# Patient Record
Sex: Female | Born: 1970 | Race: White | Hispanic: No | Marital: Married | Smoking: Never smoker
Health system: Southern US, Community
[De-identification: ages and names within clinical notes are randomized; demographics above are authoritative.]

## PROBLEM LIST (undated history)

## (undated) DIAGNOSIS — T7840XA Allergy, unspecified, initial encounter: Secondary | ICD-10-CM

## (undated) HISTORY — PX: EYE SURGERY: SHX253

## (undated) HISTORY — DX: Allergy, unspecified, initial encounter: T78.40XA

---

## 1999-06-04 ENCOUNTER — Other Ambulatory Visit: Admission: RE | Admit: 1999-06-04 | Discharge: 1999-06-04 | Payer: Self-pay | Admitting: Obstetrics and Gynecology

## 2000-04-06 ENCOUNTER — Other Ambulatory Visit: Admission: RE | Admit: 2000-04-06 | Discharge: 2000-04-06 | Payer: Self-pay | Admitting: Obstetrics and Gynecology

## 2000-04-25 ENCOUNTER — Encounter: Payer: Self-pay | Admitting: Obstetrics and Gynecology

## 2000-04-25 ENCOUNTER — Ambulatory Visit (HOSPITAL_COMMUNITY): Admission: RE | Admit: 2000-04-25 | Discharge: 2000-04-25 | Payer: Self-pay | Admitting: Obstetrics and Gynecology

## 2000-05-04 ENCOUNTER — Encounter: Payer: Self-pay | Admitting: Obstetrics and Gynecology

## 2000-05-04 ENCOUNTER — Ambulatory Visit (HOSPITAL_COMMUNITY): Admission: RE | Admit: 2000-05-04 | Discharge: 2000-05-04 | Payer: Self-pay | Admitting: Obstetrics and Gynecology

## 2000-05-26 ENCOUNTER — Observation Stay (HOSPITAL_COMMUNITY): Admission: RE | Admit: 2000-05-26 | Discharge: 2000-05-27 | Payer: Self-pay | Admitting: Obstetrics and Gynecology

## 2000-12-15 ENCOUNTER — Other Ambulatory Visit: Admission: RE | Admit: 2000-12-15 | Discharge: 2000-12-15 | Payer: Self-pay | Admitting: Obstetrics and Gynecology

## 2001-01-19 ENCOUNTER — Ambulatory Visit (HOSPITAL_COMMUNITY): Admission: RE | Admit: 2001-01-19 | Discharge: 2001-01-19 | Payer: Self-pay | Admitting: Obstetrics and Gynecology

## 2001-01-19 ENCOUNTER — Encounter: Payer: Self-pay | Admitting: Obstetrics and Gynecology

## 2001-03-21 ENCOUNTER — Other Ambulatory Visit: Admission: RE | Admit: 2001-03-21 | Discharge: 2001-03-21 | Payer: Self-pay | Admitting: Obstetrics and Gynecology

## 2002-09-24 ENCOUNTER — Other Ambulatory Visit: Admission: RE | Admit: 2002-09-24 | Discharge: 2002-09-24 | Payer: Self-pay | Admitting: Obstetrics and Gynecology

## 2003-06-25 ENCOUNTER — Other Ambulatory Visit: Admission: RE | Admit: 2003-06-25 | Discharge: 2003-06-25 | Payer: Self-pay | Admitting: Obstetrics and Gynecology

## 2004-01-18 ENCOUNTER — Inpatient Hospital Stay (HOSPITAL_COMMUNITY): Admission: AD | Admit: 2004-01-18 | Discharge: 2004-01-20 | Payer: Self-pay | Admitting: Obstetrics and Gynecology

## 2005-05-07 ENCOUNTER — Other Ambulatory Visit: Admission: RE | Admit: 2005-05-07 | Discharge: 2005-05-07 | Payer: Self-pay | Admitting: Obstetrics and Gynecology

## 2005-11-16 ENCOUNTER — Inpatient Hospital Stay (HOSPITAL_COMMUNITY): Admission: AD | Admit: 2005-11-16 | Discharge: 2005-11-18 | Payer: Self-pay | Admitting: Obstetrics and Gynecology

## 2010-03-30 ENCOUNTER — Other Ambulatory Visit: Payer: Self-pay | Admitting: Obstetrics and Gynecology

## 2010-03-30 DIAGNOSIS — Z1239 Encounter for other screening for malignant neoplasm of breast: Secondary | ICD-10-CM

## 2010-04-23 ENCOUNTER — Ambulatory Visit
Admission: RE | Admit: 2010-04-23 | Discharge: 2010-04-23 | Disposition: A | Discharge status: Home / Self Care | Payer: Self-pay | Source: Ambulatory Visit | Attending: Obstetrics and Gynecology | Admitting: Obstetrics and Gynecology

## 2010-04-23 DIAGNOSIS — Z1239 Encounter for other screening for malignant neoplasm of breast: Secondary | ICD-10-CM

## 2011-02-15 ENCOUNTER — Other Ambulatory Visit: Payer: Self-pay | Admitting: Allergy

## 2011-02-15 ENCOUNTER — Ambulatory Visit
Admission: RE | Admit: 2011-02-15 | Discharge: 2011-02-15 | Disposition: A | Discharge status: Home / Self Care | Payer: BC Managed Care – PPO | Source: Ambulatory Visit | Attending: Allergy | Admitting: Allergy

## 2011-02-15 DIAGNOSIS — J45909 Unspecified asthma, uncomplicated: Secondary | ICD-10-CM

## 2011-07-19 ENCOUNTER — Other Ambulatory Visit: Payer: Self-pay | Admitting: Obstetrics and Gynecology

## 2011-07-19 DIAGNOSIS — Z1231 Encounter for screening mammogram for malignant neoplasm of breast: Secondary | ICD-10-CM

## 2011-08-16 ENCOUNTER — Ambulatory Visit
Admission: RE | Admit: 2011-08-16 | Discharge: 2011-08-16 | Disposition: A | Discharge status: Home / Self Care | Payer: BC Managed Care – PPO | Source: Ambulatory Visit | Attending: Obstetrics and Gynecology | Admitting: Obstetrics and Gynecology

## 2011-08-16 DIAGNOSIS — Z1231 Encounter for screening mammogram for malignant neoplasm of breast: Secondary | ICD-10-CM

## 2012-09-18 ENCOUNTER — Other Ambulatory Visit: Payer: Self-pay

## 2012-09-18 DIAGNOSIS — Z1231 Encounter for screening mammogram for malignant neoplasm of breast: Secondary | ICD-10-CM

## 2012-10-05 ENCOUNTER — Ambulatory Visit
Admission: RE | Admit: 2012-10-05 | Discharge: 2012-10-05 | Disposition: A | Discharge status: Home / Self Care | Payer: BC Managed Care – PPO | Source: Ambulatory Visit

## 2012-10-05 DIAGNOSIS — Z1231 Encounter for screening mammogram for malignant neoplasm of breast: Secondary | ICD-10-CM

## 2013-09-04 ENCOUNTER — Other Ambulatory Visit: Payer: Self-pay

## 2013-09-04 DIAGNOSIS — Z1231 Encounter for screening mammogram for malignant neoplasm of breast: Secondary | ICD-10-CM

## 2013-10-08 ENCOUNTER — Ambulatory Visit
Admission: RE | Admit: 2013-10-08 | Discharge: 2013-10-08 | Disposition: A | Discharge status: Home / Self Care | Payer: BC Managed Care – PPO | Source: Ambulatory Visit

## 2013-10-08 DIAGNOSIS — Z1231 Encounter for screening mammogram for malignant neoplasm of breast: Secondary | ICD-10-CM

## 2014-09-25 ENCOUNTER — Other Ambulatory Visit: Payer: Self-pay

## 2014-09-25 DIAGNOSIS — Z1231 Encounter for screening mammogram for malignant neoplasm of breast: Secondary | ICD-10-CM

## 2014-10-30 ENCOUNTER — Ambulatory Visit
Admission: RE | Admit: 2014-10-30 | Discharge: 2014-10-30 | Disposition: A | Discharge status: Home / Self Care | Payer: BLUE CROSS/BLUE SHIELD | Source: Ambulatory Visit

## 2014-10-30 DIAGNOSIS — Z1231 Encounter for screening mammogram for malignant neoplasm of breast: Secondary | ICD-10-CM

## 2015-09-22 ENCOUNTER — Other Ambulatory Visit: Payer: Self-pay | Admitting: Obstetrics and Gynecology

## 2015-09-22 DIAGNOSIS — Z1231 Encounter for screening mammogram for malignant neoplasm of breast: Secondary | ICD-10-CM

## 2015-10-31 ENCOUNTER — Ambulatory Visit
Admission: RE | Admit: 2015-10-31 | Discharge: 2015-10-31 | Disposition: A | Discharge status: Home / Self Care | Payer: BLUE CROSS/BLUE SHIELD | Source: Ambulatory Visit | Attending: Obstetrics and Gynecology | Admitting: Obstetrics and Gynecology

## 2015-10-31 DIAGNOSIS — Z1231 Encounter for screening mammogram for malignant neoplasm of breast: Secondary | ICD-10-CM

## 2015-12-04 ENCOUNTER — Other Ambulatory Visit: Payer: Self-pay | Admitting: Obstetrics and Gynecology

## 2015-12-04 DIAGNOSIS — N63 Unspecified lump in unspecified breast: Secondary | ICD-10-CM

## 2015-12-12 ENCOUNTER — Ambulatory Visit
Admission: RE | Admit: 2015-12-12 | Discharge: 2015-12-12 | Disposition: A | Discharge status: Home / Self Care | Payer: BLUE CROSS/BLUE SHIELD | Source: Ambulatory Visit | Attending: Obstetrics and Gynecology | Admitting: Obstetrics and Gynecology

## 2015-12-12 DIAGNOSIS — N63 Unspecified lump in unspecified breast: Secondary | ICD-10-CM

## 2016-05-06 DIAGNOSIS — M79642 Pain in left hand: Secondary | ICD-10-CM | POA: Insufficient documentation

## 2016-05-06 DIAGNOSIS — M19042 Primary osteoarthritis, left hand: Secondary | ICD-10-CM | POA: Insufficient documentation

## 2018-05-12 IMAGING — MG 2D DIGITAL DIAGNOSTIC UNILATERAL LEFT MAMMOGRAM WITH CAD AND ADJ
8 series · 8 of 20 positions shown · non-contrast
Comparison: Recent screening mammogram 10/31/2015 and earlier
[facility]

CLINICAL DATA: 45-year-old patient with palpable lump in the
retroareolar left breast, 7 o'clock region. She noticed it initially
3-4 weeks ago, presenting as an area of pain when her arm brushed
against it while she was doing some work. She then felt for a lump.
She has felt a focal lump in this region and believes that the lump
has decreased in prominence since she first noticed it.

EXAM:
2D DIGITAL DIAGNOSTIC LEFT MAMMOGRAM WITH CAD AND ADJUNCT TOMO
ULTRASOUND LEFT BREAST

[L MLO]
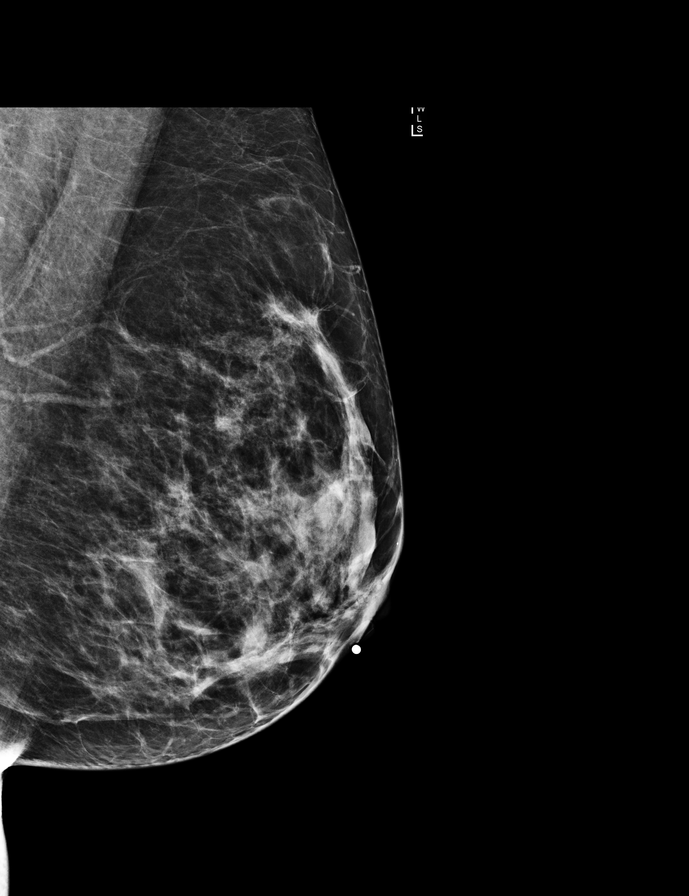

[L CC synth-2D]
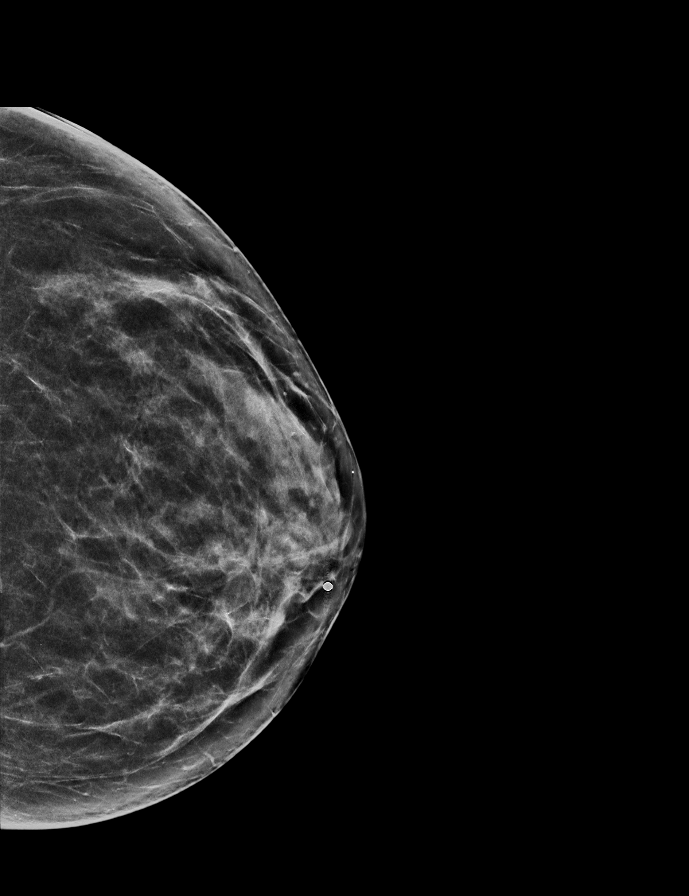

[L TAN]
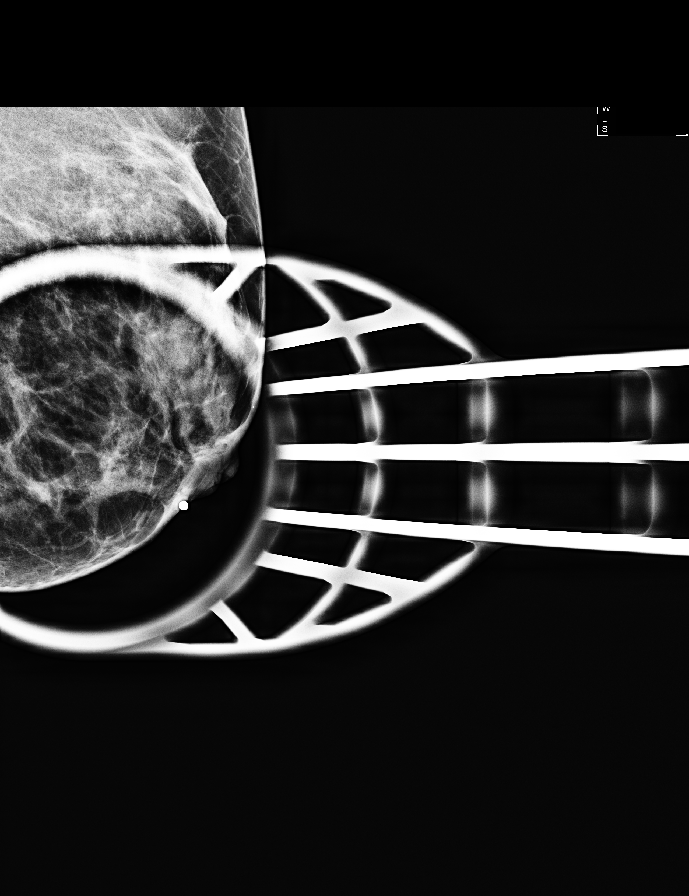

[L MLO synth-2D]
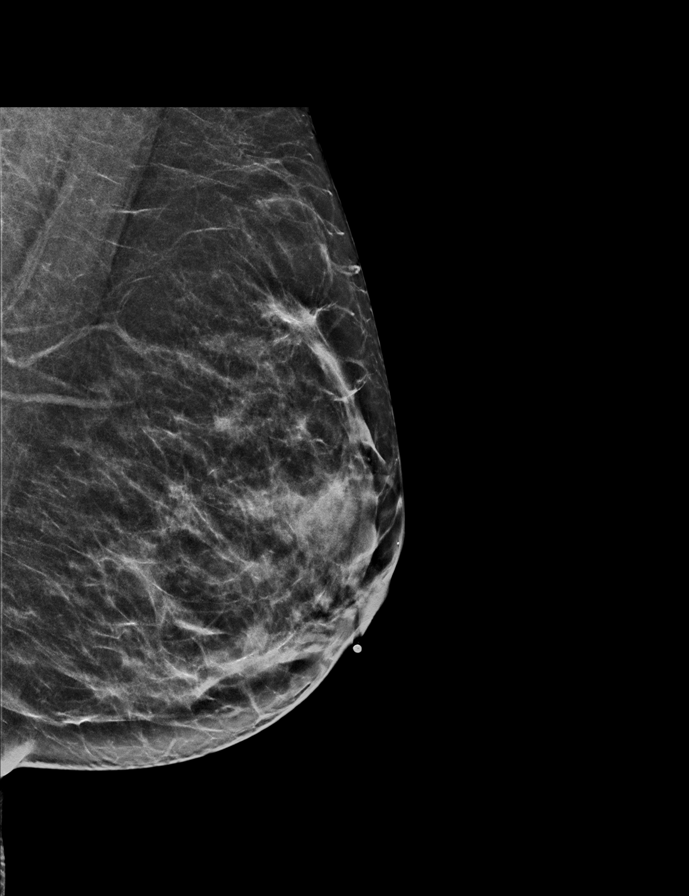

[L CC]
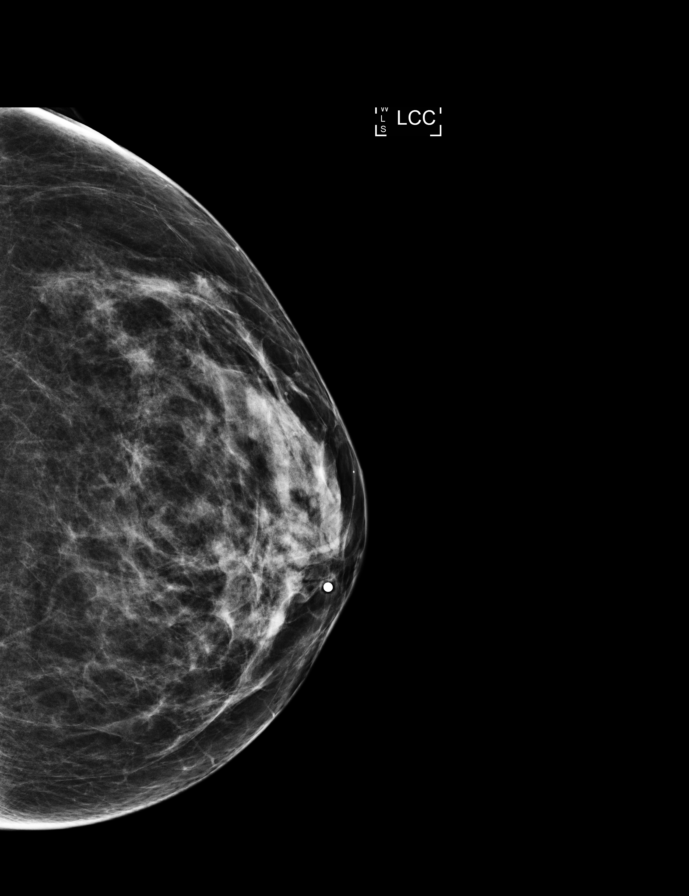

[L MLO tomo · tomo slice 29/57.0]
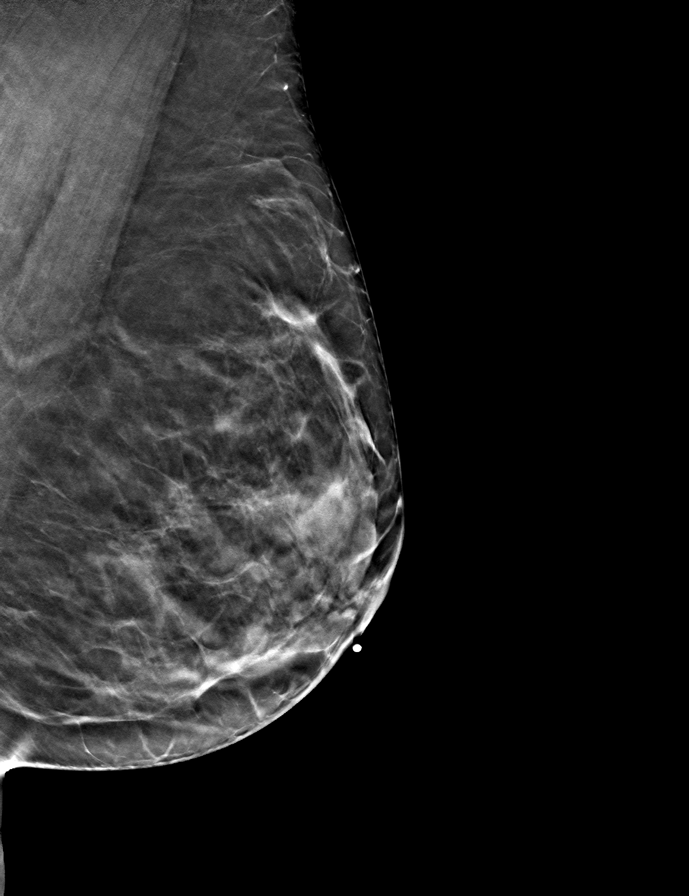

[L TAN tomo · tomo slice 23/45.0]
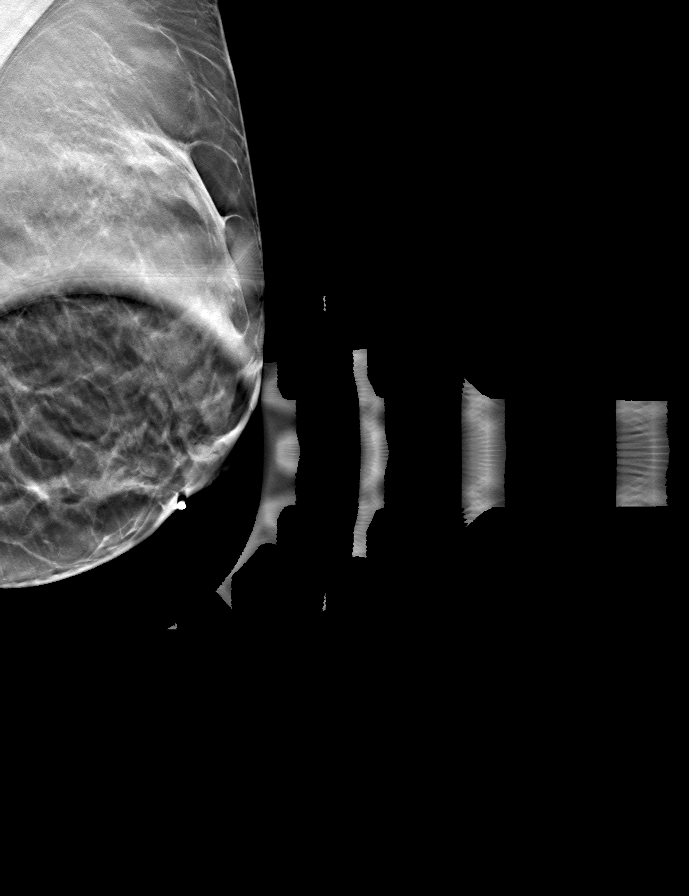

[L CC tomo · tomo slice 31/60.0]
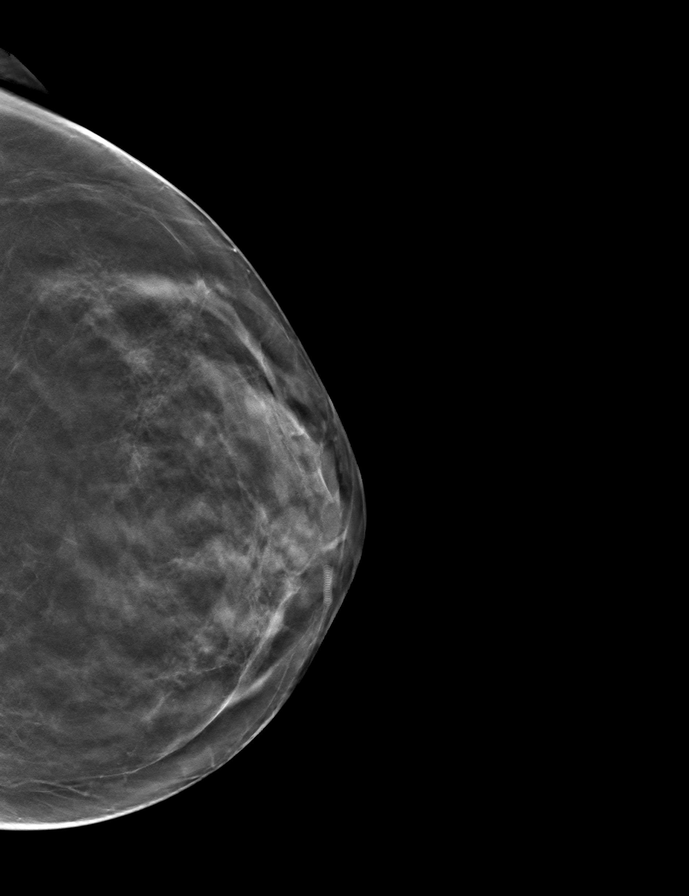

[8 of 20 positions shown; findings below may reference images not displayed]

ACR Breast Density Category c: The breast tissue is heterogeneously
dense, which may obscure small masses.
FINDINGS: The metallic skin marker was placed in the region of patient
concern, lower inner quadrant, near the areola. No mass,
architectural distortion, or suspicious microcalcification is
identified in the left breast. A spot tangential view of the region
of patient concern is negative.

Mammographic images were processed with CAD.

On physical exam, I do palpate a smooth ridgelike area in the region
of patient concern, 7 o'clock position retroareolar. I do not
palpate a discrete firm mass. The skin of the left breast appears
normal.

Targeted ultrasound is performed, showing an island of normal
retroareolar fibroglandular tissue is imaged in the region of
patient concern. No solid or cystic mass, duct ectasia, or fluid
collection.
IMPRESSION: No evidence of malignancy in the left breast.

RECOMMENDATION:
Screening mammogram in one year.(Code:[id number]) I have asked the
patient to return earlier if she has any progressive or new areas of
concern.

I have discussed the findings and recommendations with the patient.
Results were also provided in writing at the conclusion of the
visit. If applicable, a reminder letter will be sent to the patient
regarding the next appointment.

BI-RADS CATEGORY  1: Negative.

## 2019-07-03 ENCOUNTER — Ambulatory Visit (INDEPENDENT_AMBULATORY_CARE_PROVIDER_SITE_OTHER): Payer: 59 | Admitting: Physician Assistant

## 2019-07-03 ENCOUNTER — Encounter: Payer: Self-pay | Admitting: Physician Assistant

## 2019-07-03 ENCOUNTER — Other Ambulatory Visit: Payer: Self-pay

## 2019-07-03 VITALS — BP 128/80 | HR 103 | Temp 98.1°F | Resp 16

## 2019-07-03 DIAGNOSIS — M545 Low back pain, unspecified: Secondary | ICD-10-CM

## 2019-07-03 MED ORDER — MELOXICAM 7.5 MG PO TABS: 7.5000 mg | ORAL_TABLET | Freq: Every day | ORAL | 1 refills | Status: DC

## 2019-07-03 MED ORDER — TIZANIDINE HCL 4 MG PO TABS: 4.0000 mg | ORAL_TABLET | Freq: Three times a day (TID) | ORAL | 1 refills | Status: DC | PRN

## 2019-07-03 NOTE — Progress Notes (Signed)
Acute Office Visit  Subjective:    Patient ID: Lisa Cantrell, female    DOB: Jul 08, 1970, 49 y.o.   MRN: [id number]  Chief Complaint  Patient presents with  . Back Pain    HPI Patient is in today for low back pain On Saturday pt bent over to dry her hair and had immediate pain across her lower back causing movement difficult to get to full standing position  She has tried some medications with minimal relief Worse with standing straight and walking Denies pain/numbness in extremities  Past Medical History:  Diagnosis Date  . Allergy     Past Surgical History:  Procedure Laterality Date  . EYE SURGERY      Family History  Problem Relation Age of Onset  . Hypertension Other   . Diabetes Other   . Prostate cancer Other     Social History   Socioeconomic History  . Marital status: Married    Spouse name: Not on file  . Number of children: 2  . Years of education: Not on file  . Highest education level: Not on file  Occupational History  . Occupation: self employed  Tobacco Use  . Smoking status: Never Smoker  . Smokeless tobacco: Never Used  Substance and Sexual Activity  . Alcohol use: Never  . Drug use: Never  . Sexual activity: Not on file  Other Topics Concern  . Not on file  Social History Narrative  . Not on file   Social Determinants of Health   Financial Resource Strain:   . Difficulty of Paying Living Expenses:   Food Insecurity:   . Worried About Programme researcher, broadcasting/film/video in the Last Year:   . Barista in the Last Year:   Transportation Needs:   . Freight forwarder (Medical):   Marland Kitchen Lack of Transportation (Non-Medical):   Physical Activity:   . Days of Exercise per Week:   . Minutes of Exercise per Session:   Stress:   . Feeling of Stress :   Social Connections:   . Frequency of Communication with Friends and Family:   . Frequency of Social Gatherings with Friends and Family:   . Attends Religious Services:   . Active Member of  Clubs or Organizations:   . Attends Banker Meetings:   Marland Kitchen Marital Status:   Intimate Partner Violence:   . Fear of Current or Ex-Partner:   . Emotionally Abused:   Marland Kitchen Physically Abused:   . Sexually Abused:      Current Outpatient Medications:  .  fluticasone (FLOVENT HFA) 110 MCG/ACT inhaler, , Disp: , Rfl:  .  levocetirizine (XYZAL) 5 MG tablet, , Disp: , Rfl:  .  meloxicam (MOBIC) 7.5 MG tablet, Take 1 tablet (7.5 mg total) by mouth daily., Disp: 90 tablet, Rfl: 1 .  montelukast (SINGULAIR) 10 MG tablet, , Disp: , Rfl:  .  tiZANidine (ZANAFLEX) 4 MG tablet, Take 1 tablet (4 mg total) by mouth every 8 (eight) hours as needed for muscle spasms., Disp: 30 tablet, Rfl: 1   Allergies  Allergen Reactions  . Hydromorphone Rash  . Penicillin G Rash    CONSTITUTIONAL: Negative for chills, fatigue, fever, unintentional weight gain and unintentional weight loss.   CARDIOVASCULAR: Negative for chest pain, dizziness, palpitations and pedal edema.  RESPIRATORY: Negative for recent cough and dyspnea.  GASTROINTESTINAL: Negative for abdominal pain, acid reflux symptoms, constipation, diarrhea, nausea and vomiting.  MSK: see HPI INTEGUMENTARY: Negative for  rash.          Objective:    PHYSICAL EXAM:   VS: BP 128/80   Pulse (!) 103   Temp 98.1 F (36.7 C)   Resp 16   SpO2 94%   GEN: Well nourished, well developed, in acute pain walking slow and stooped  Cardiac: RRR; no murmurs, rubs, or gallops,no edema - no significant varicosities Respiratory:  normal respiratory rate and pattern with no distress - normal breath sounds with no rales, rhonchi, wheezes or rubs  MS: no deformity or atrophy - moderate tenderness bilaterally across lower back  Neuro:  Alert and Oriented x 3, Strength and sensation are intact - CN II-Xii grossly intact Psych: euthymic mood, appropriate affect and demeanor   Wt Readings from Last 3 Encounters:  No data found for Wt    Health  Maintenance Due  Topic Date Due  . HIV Screening  Never done  . TETANUS/TDAP  Never done  . PAP SMEAR-Modifier  Never done  . MAMMOGRAM  03/17/2019  . COLONOSCOPY  Never done    There are no preventive care reminders to display for this patient.        Assessment & Plan:   Problem List Items Addressed This Visit      Other   Acute bilateral low back pain without sciatica - Primary    rom exercises mobic and tizanadine as directed Alternate heat and ice      Relevant Medications   meloxicam (MOBIC) 7.5 MG tablet   tiZANidine (ZANAFLEX) 4 MG tablet       Meds ordered this encounter  Medications  . meloxicam (MOBIC) 7.5 MG tablet    Sig: Take 1 tablet (7.5 mg total) by mouth daily.    Dispense:  90 tablet    Refill:  1    Order Specific Question:   Supervising Provider    AnswerRochel Brome [id number]  . tiZANidine (ZANAFLEX) 4 MG tablet    Sig: Take 1 tablet (4 mg total) by mouth every 8 (eight) hours as needed for muscle spasms.    Dispense:  30 tablet    Refill:  1    Order Specific Question:   Supervising Provider    Answer:   COX, Lynder Parents     [place], PA-C

## 2019-07-03 NOTE — Assessment & Plan Note (Signed)
rom exercises mobic and tizanadine as directed Alternate heat and ice

## 2019-07-03 NOTE — Patient Instructions (Signed)

## 2019-11-19 ENCOUNTER — Encounter: Payer: Self-pay | Admitting: Physician Assistant

## 2019-11-19 ENCOUNTER — Other Ambulatory Visit: Payer: Self-pay

## 2019-11-19 ENCOUNTER — Ambulatory Visit (INDEPENDENT_AMBULATORY_CARE_PROVIDER_SITE_OTHER): Payer: 59 | Admitting: Physician Assistant

## 2019-11-19 VITALS — BP 120/74 | HR 78 | Temp 97.5°F | Resp 18 | Ht 61.5 in | Wt 176.0 lb

## 2019-11-19 DIAGNOSIS — Z23 Encounter for immunization: Secondary | ICD-10-CM | POA: Insufficient documentation

## 2019-11-19 DIAGNOSIS — M2559 Pain in other specified joint: Secondary | ICD-10-CM | POA: Diagnosis not present

## 2019-11-19 DIAGNOSIS — Z0000 Encounter for general adult medical examination without abnormal findings: Secondary | ICD-10-CM | POA: Diagnosis not present

## 2019-11-19 DIAGNOSIS — M2550 Pain in unspecified joint: Secondary | ICD-10-CM | POA: Insufficient documentation

## 2019-11-19 NOTE — Assessment & Plan Note (Signed)
flucelvax given 

## 2019-11-19 NOTE — Assessment & Plan Note (Signed)
Continue healthy lifestyle labwork pending Handout given

## 2019-11-19 NOTE — Assessment & Plan Note (Signed)
Continue mobic labwork pending

## 2019-11-19 NOTE — Progress Notes (Signed)
Subjective:  Patient ID: Lisa Cantrell, female    DOB: 01-01-1971  Age: 49 y.o. MRN: [id number]  Chief Complaint  Patient presents with  . Annual Exam    HPI Well Adult Physical: Patient here for a comprehensive physical exam.The patient reports wants labwork to check for RA - hands with pain/nodules Do you take any herbs or supplements that were not prescribed by a doctor? yes Are you taking calcium supplements? no Are you taking aspirin daily? no  Encounter for general adult medical examination without abnormal findings  Physical ("At Risk" items are starred): Patient's last physical exam was 1 year ago .  Blood Pressure: Normal (BP less than 120/80) ;  Medical History: Patient history reviewed ; Family history reviewed ;  Allergies Reviewed: No change in current allergies ;  Medications Reviewed: Medications reviewed - no changes ;  Lipids: Normal lipid levels ;  Smoking: Life-long non-smoker ;  Alcohol/Drug Use: Is a non-drinker ; No illicit drug use ;  Patient is not afflicted from Stress Incontinence and Urge Incontinence  Safety: reviewed ; Patient wears a seat belt, has smoke detectors, has carbon monoxide detectors, practices appropriate gun safety, and wears sunscreen with extended sun exposure. Dental Care: biannual cleanings, brushes and flosses daily. Ophthalmology/Optometry: Annual visit.  Hearing loss: none Vision impairments: none  Pt will be seeing GYN for pap smear and will get mammogram scheduled            Social Hx   Social History   Socioeconomic History  . Marital status: Married    Spouse name: Not on file  . Number of children: 2  . Years of education: Not on file  . Highest education level: Not on file  Occupational History  . Occupation: self employed  Tobacco Use  . Smoking status: Never Smoker  . Smokeless tobacco: Never Used  Vaping Use  . Vaping Use: Never used  Substance and Sexual Activity  . Alcohol use: Never  . Drug use:  Never  . Sexual activity: Not on file  Other Topics Concern  . Not on file  Social History Narrative  . Not on file   Social Determinants of Health   Financial Resource Strain:   . Difficulty of Paying Living Expenses: Not on file  Food Insecurity:   . Worried About Programme researcher, broadcasting/film/video in the Last Year: Not on file  . Ran Out of Food in the Last Year: Not on file  Transportation Needs:   . Lack of Transportation (Medical): Not on file  . Lack of Transportation (Non-Medical): Not on file  Physical Activity:   . Days of Exercise per Week: Not on file  . Minutes of Exercise per Session: Not on file  Stress:   . Feeling of Stress : Not on file  Social Connections:   . Frequency of Communication with Friends and Family: Not on file  . Frequency of Social Gatherings with Friends and Family: Not on file  . Attends Religious Services: Not on file  . Active Member of Clubs or Organizations: Not on file  . Attends Banker Meetings: Not on file  . Marital Status: Not on file   Past Medical History:  Diagnosis Date  . Allergy    Past Surgical History:  Procedure Laterality Date  . EYE SURGERY      Family History  Problem Relation Age of Onset  . Hypertension Other   . Diabetes Other   . Prostate cancer Other  Review of Systems CONSTITUTIONAL: Negative for chills, fatigue, fever, unintentional weight gain and unintentional weight loss.  E/N/T: Negative for ear pain, nasal congestion and sore throat.  CARDIOVASCULAR: Negative for chest pain, dizziness, palpitations and pedal edema.  RESPIRATORY: Negative for recent cough and dyspnea.  GASTROINTESTINAL: Negative for abdominal pain, acid reflux symptoms, constipation, diarrhea, nausea and vomiting.  MSK: joint pains - esp hands INTEGUMENTARY: Negative for rash.  NEUROLOGICAL: Negative for dizziness and headaches.  PSYCHIATRIC: Negative for sleep disturbance and to question depression screen.  Negative for  depression, negative for anhedonia.       Objective:  BP 120/74   Pulse 78   Temp (!) 97.5 F (36.4 C)   Resp 18   Ht 5' 1.5" (1.562 m)   Wt 176 lb (79.8 kg)   SpO2 99%   BMI 32.72 kg/m   BP/Weight 11/19/2019 07/03/2019  Systolic BP 120 128  Diastolic BP 74 80  Wt. (Lbs) 176 -  BMI 32.72 -    Physical Exam PHYSICAL EXAM:   VS: BP 120/74   Pulse 78   Temp (!) 97.5 F (36.4 C)   Resp 18   Ht 5' 1.5" (1.562 m)   Wt 176 lb (79.8 kg)   SpO2 99%   BMI 32.72 kg/m   GEN: Well nourished, well developed, in no acute distress  HEENT: normal external ears and nose - normal external auditory canals and TMS - hearing grossly normal - normal nasal mucosa and septum - Lips, Teeth and Gums - normal  Oropharynx - normal mucosa, palate, and posterior pharynx Neck: no JVD or masses - no thyromegaly Cardiac: RRR; no murmurs, rubs, or gallops,no edema - no significant varicosities Respiratory:  normal respiratory rate and pattern with no distress - normal breath sounds with no rales, rhonchi, wheezes or rubs GI: normal bowel sounds, no masses or tenderness MS: hands show arthritic changes Skin: warm and dry, no rash  Neuro:  Alert and Oriented x 3, Strength and sensation are intact - CN II-Xii grossly intact Psych: euthymic mood, appropriate affect and demeanor  No results found for: WBC, HGB, HCT, PLT, GLUCOSE, CHOL, TRIG, HDL, LDLDIRECT, LDLCALC, ALT, AST, NA, K, CL, CREATININE, BUN, CO2, TSH, PSA, INR, GLUF, HGBA1C, MICROALBUR    Assessment & Plan:  1. Annual physical exam - CBC with Differential/Platelet - Comprehensive metabolic panel - TSH - Lipid panel  2. Pain in other joint - Uric A+ANA+CRP+RF Qn  3. Need for tetanus, diphtheria, and acellular pertussis (Tdap) vaccine - Tdap vaccine greater than or equal to 7yo IM  4. Need for prophylactic vaccination and inoculation against influenza - Flu Vaccine MDCK QUAD PF    Body mass index is 32.72 kg/m.   These are  the goals we discussed: Goals   None      This is a list of the screening recommended for you and due dates:  Health Maintenance  Topic Date Due  . Tetanus Vaccine  Never done  . Flu Shot  Never done  . Pap Smear  11/19/2019*  . Mammogram  11/18/2020*  . Colon Cancer Screening  11/18/2020*  . COVID-19 Vaccine  Completed  .  Hepatitis C: One time screening is recommended by Center for Disease Control  (CDC) for  adults born from [birth year] through 1966.   Discontinued  . HIV Screening  Discontinued  *Topic was postponed. The date shown is not the original due date.     AN INDIVIDUALIZED CARE PLAN: was established or  reinforced today.   SELF MANAGEMENT: The patient and I together assessed ways to personally work towards obtaining the recommended goals  Support needs The patient and/or family needs were assessed and services were offered if appropriate.  No orders of the defined types were placed in this encounter.   Follow-up: Return in about 1 year (around 11/18/2020).  An After Visit Summary was printed and given to the patient.  Vickey Sages [facility] [phone]

## 2019-11-19 NOTE — Patient Instructions (Signed)

## 2019-11-19 NOTE — Assessment & Plan Note (Signed)
Tdap given.  

## 2019-11-20 LAB — CBC WITH DIFFERENTIAL/PLATELET
Basophils Absolute: 0.1 10*3/uL (ref 0.0–0.2)
Basos: 1 %
EOS (ABSOLUTE): 0.3 10*3/uL (ref 0.0–0.4)
Eos: 5 %
Hematocrit: 38.4 % (ref 34.0–46.6)
Hemoglobin: 12.5 g/dL (ref 11.1–15.9)
Immature Grans (Abs): 0 10*3/uL (ref 0.0–0.1)
Immature Granulocytes: 0 %
Lymphocytes Absolute: 2 10*3/uL (ref 0.7–3.1)
Lymphs: 39 %
MCH: 27.5 pg (ref 26.6–33.0)
MCHC: 32.6 g/dL (ref 31.5–35.7)
MCV: 84 fL (ref 79–97)
Monocytes Absolute: 0.6 10*3/uL (ref 0.1–0.9)
Monocytes: 12 %
Neutrophils Absolute: 2.2 10*3/uL (ref 1.4–7.0)
Neutrophils: 43 %
Platelets: 253 10*3/uL (ref 150–450)
RBC: 4.55 x10E6/uL (ref 3.77–5.28)
RDW: 13.7 % (ref 11.7–15.4)
WBC: 5.1 10*3/uL (ref 3.4–10.8)

## 2019-11-20 LAB — URIC A+ANA+CRP+RF QN
Anti Nuclear Antibody (ANA): NEGATIVE
CRP: 3 mg/L (ref 0–10)
Rhuematoid fact SerPl-aCnc: <10 IU/mL (ref 0.0–13.9)
Uric Acid: 4.9 mg/dL (ref 2.6–6.2)

## 2019-11-20 LAB — TSH: TSH: 1.42 u[IU]/mL (ref 0.450–4.500)

## 2019-11-20 LAB — LIPID PANEL
Chol/HDL Ratio: 3 ratio (ref 0.0–4.4)
Cholesterol, Total: 214 mg/dL — ABNORMAL HIGH (ref 100–199)
HDL: 72 mg/dL (ref >39)
LDL Chol Calc (NIH): 112 mg/dL — ABNORMAL HIGH (ref 0–99)
Triglycerides: 175 mg/dL — ABNORMAL HIGH (ref 0–149)
VLDL Cholesterol Cal: 30 mg/dL (ref 5–40)

## 2019-11-20 LAB — COMPREHENSIVE METABOLIC PANEL
ALT: 17 IU/L (ref 0–32)
AST: 17 IU/L (ref 0–40)
Albumin/Globulin Ratio: 2 (ref 1.2–2.2)
Albumin: 4.7 g/dL (ref 3.8–4.8)
Alkaline Phosphatase: 117 IU/L (ref 44–121)
BUN/Creatinine Ratio: 22 (ref 9–23)
BUN: 19 mg/dL (ref 6–24)
Bilirubin Total: 0.3 mg/dL (ref 0.0–1.2)
CO2: 22 mmol/L (ref 20–29)
Calcium: 9.7 mg/dL (ref 8.7–10.2)
Chloride: 105 mmol/L (ref 96–106)
Creatinine, Ser: 0.85 mg/dL (ref 0.57–1.00)
GFR calc Af Amer: 92 mL/min/{1.73_m2} (ref >59)
GFR calc non Af Amer: 80 mL/min/{1.73_m2} (ref >59)
Globulin, Total: 2.4 g/dL (ref 1.5–4.5)
Glucose: 98 mg/dL (ref 65–99)
Potassium: 4.3 mmol/L (ref 3.5–5.2)
Sodium: 141 mmol/L (ref 134–144)
Total Protein: 7.1 g/dL (ref 6.0–8.5)

## 2019-11-20 LAB — CARDIOVASCULAR RISK ASSESSMENT

## 2020-10-06 ENCOUNTER — Encounter: Payer: Self-pay | Admitting: Physician Assistant

## 2020-10-06 ENCOUNTER — Other Ambulatory Visit: Payer: Self-pay

## 2020-10-06 ENCOUNTER — Ambulatory Visit (INDEPENDENT_AMBULATORY_CARE_PROVIDER_SITE_OTHER): Payer: BLUE CROSS/BLUE SHIELD | Admitting: Physician Assistant

## 2020-10-06 ENCOUNTER — Ambulatory Visit (INDEPENDENT_AMBULATORY_CARE_PROVIDER_SITE_OTHER): Payer: BLUE CROSS/BLUE SHIELD

## 2020-10-06 VITALS — BP 120/74 | HR 76 | Temp 97.5°F | Ht 61.5 in | Wt 166.8 lb

## 2020-10-06 DIAGNOSIS — R6882 Decreased libido: Secondary | ICD-10-CM | POA: Insufficient documentation

## 2020-10-06 DIAGNOSIS — Z23 Encounter for immunization: Secondary | ICD-10-CM

## 2020-10-06 DIAGNOSIS — Z0000 Encounter for general adult medical examination without abnormal findings: Secondary | ICD-10-CM | POA: Diagnosis not present

## 2020-10-06 NOTE — Progress Notes (Signed)
   Covid-19 Vaccination Clinic  Name:  Lisa Cantrell    MRN: [id number] DOB: [birth year]  10/06/2020  [name] was observed post Covid-19 immunization for 15 minutes without incident. She was provided with Vaccine Information Sheet and instruction to access the V-Safe system.   [name] was instructed to call 911 with any severe reactions post vaccine: Difficulty breathing  Swelling of face and throat  A fast heartbeat  A bad rash all over body  Dizziness and weakness   Immunizations Administered     Name Date Dose VIS Date Route   PFIZER Comrnaty(Gray TOP) Covid-19 Vaccine 10/06/2020  2:30 PM 0.3 mL 02/14/2020 Intramuscular   Manufacturer: [employer], [employer]   Lot: [id number]   NDC: [phone]

## 2020-10-06 NOTE — Progress Notes (Signed)
Subjective:  Patient ID: Lisa Cantrell, female    DOB: 1971/01/27  Age: 50 y.o. MRN: [id number]  Chief Complaint  Patient presents with   Annual Exam    HPI Well Adult Physical: Patient here for a comprehensive physical exam.The patient reports no problems Do you take any herbs or supplements that were not prescribed by a doctor? no Are you taking calcium supplements? no Are you taking aspirin daily? no  Encounter for general adult medical examination without abnormal findings  Physical ("At Risk" items are starred): Patient's last physical exam was 1 year ago .  Smoking: Life-long non-smoker ;  Physical Activity: Exercises at least 3 times per week ;  Alcohol/Drug Use: Is a non-drinker ; No illicit drug use ;  Patient is not afflicted from Stress Incontinence and Urge Incontinence  Safety: reviewed. Patient wears a seat belt, has smoke detectors, has carbon monoxide detectors, practices appropriate gun safety, and wears sunscreen with extended sun exposure. Dental Care: biannual cleanings, brushes and flosses daily. Ophthalmology/Optometry: Annual visit.  Hearing loss: none Vision impairments: none  Last mammogram - 03/2020  Flowsheet Row Office Visit from 10/06/2020 in [facility]  PHQ-2 Total Score 0               Social Hx   Social History   Socioeconomic History   Marital status: Married    Spouse name: Not on file   Number of children: 2   Years of education: Not on file   Highest education level: Not on file  Occupational History   Occupation: self employed  Tobacco Use   Smoking status: Never   Smokeless tobacco: Never  Vaping Use   Vaping Use: Never used  Substance and Sexual Activity   Alcohol use: Never   Drug use: Never   Sexual activity: Not on file  Other Topics Concern   Not on file  Social History Narrative   Not on file   Social Determinants of Health   Financial Resource Strain: Not on file  Food Insecurity: Not on file   Transportation Needs: Not on file  Physical Activity: Not on file  Stress: Not on file  Social Connections: Not on file   Past Medical History:  Diagnosis Date   Allergy    Past Surgical History:  Procedure Laterality Date   EYE SURGERY      Family History  Problem Relation Age of Onset   Hypertension Other    Diabetes Other    Prostate cancer Other     Review of Systems  CONSTITUTIONAL: Negative for chills, fatigue, fever, unintentional weight gain and unintentional weight loss.  E/N/T: Negative for ear pain, nasal congestion and sore throat.  CARDIOVASCULAR: Negative for chest pain, dizziness, palpitations and pedal edema.  RESPIRATORY: Negative for recent cough and dyspnea.  GASTROINTESTINAL: Negative for abdominal pain, acid reflux symptoms, constipation, diarrhea, nausea and vomiting.  MSK: Negative for arthralgias and myalgias.  INTEGUMENTARY: Negative for rash.  NEUROLOGICAL: Negative for dizziness and headaches.  PSYCHIATRIC: Negative for sleep disturbance and to question depression screen.  Negative for depression, negative for anhedonia.      Objective:  BP 120/74 (BP Location: Left Arm, Patient Position: Sitting, Cuff Size: Normal)   Pulse 76   Temp (!) 97.5 F (36.4 C) (Temporal)   Ht 5' 1.5" (1.562 m)   Wt 166 lb 12.8 oz (75.7 kg)   SpO2 97%   BMI 31.01 kg/m   BP/Weight 10/06/2020 11/19/2019 07/03/2019  Systolic BP  120 120 128  Diastolic BP 74 74 80  Wt. (Lbs) 166.8 176 -  BMI 31.01 32.72 -    Physical Exam PHYSICAL EXAM:   VS: BP 120/74 (BP Location: Left Arm, Patient Position: Sitting, Cuff Size: Normal)   Pulse 76   Temp (!) 97.5 F (36.4 C) (Temporal)   Ht 5' 1.5" (1.562 m)   Wt 166 lb 12.8 oz (75.7 kg)   SpO2 97%   BMI 31.01 kg/m   GEN: Well nourished, well developed, in no acute distress  HEENT: normal external ears and nose - normal external auditory canals and TMS - hearing grossly normal - normal nasal mucosa and septum - Lips,  Teeth and Gums - normal  Oropharynx - normal mucosa, palate, and posterior pharynx Neck: no JVD or masses - no thyromegaly Cardiac: RRR; no murmurs, rubs, or gallops,no edema - no significant varicosities Respiratory:  normal respiratory rate and pattern with no distress - normal breath sounds with no rales, rhonchi, wheezes or rubs GI: normal bowel sounds, no masses or tenderness MS: no deformity or atrophy  Skin: warm and dry, no rash  Neuro:  Alert and Oriented x 3, Strength and sensation are intact - CN II-Xii grossly intact Psych: euthymic mood, appropriate affect and demeanor  Lab Results  Component Value Date   WBC 5.1 11/19/2019   HGB 12.5 11/19/2019   HCT 38.4 11/19/2019   PLT 253 11/19/2019   GLUCOSE 98 11/19/2019   CHOL 214 (H) 11/19/2019   TRIG 175 (H) 11/19/2019   HDL 72 11/19/2019   LDLCALC 112 (H) 11/19/2019   ALT 17 11/19/2019   AST 17 11/19/2019   NA 141 11/19/2019   K 4.3 11/19/2019   CL 105 11/19/2019   CREATININE 0.85 11/19/2019   BUN 19 11/19/2019   CO2 22 11/19/2019   TSH 1.420 11/19/2019      Assessment & Plan:  1. Well woman exam (no gynecological exam)  Pt recently had labs with GYN - normal   Body mass index is 31.01 kg/m.   These are the goals we discussed:  Goals   None      This is a list of the screening recommended for you and due dates:  Health Maintenance  Topic Date Due   COVID-19 Vaccine (3 - Pfizer risk series) 11/22/2019   Flu Shot  10/06/2020   Mammogram  11/18/2020*   Colon Cancer Screening  11/18/2020*   Zoster (Shingles) Vaccine (1 of 2) 01/06/2021*   Pap Smear  03/18/2023   Tetanus Vaccine  11/18/2029   HPV Vaccine  Aged Out   Pneumococcal Vaccination  Discontinued   Hepatitis C Screening: USPSTF Recommendation to screen - Ages 18-79 yo.  Discontinued   HIV Screening  Discontinued  *Topic was postponed. The date shown is not the original due date.     AN INDIVIDUALIZED CARE PLAN: was established or reinforced  today.   SELF MANAGEMENT: The patient and I together assessed ways to personally work towards obtaining the recommended goals  Support needs The patient and/or family needs were assessed and services were offered if appropriate.  No orders of the defined types were placed in this encounter.   Follow-up: Return in about 1 year (around 10/06/2021) for wellness exam.  An After Visit Summary was printed and given to the patient.  Jettie Pagan [facility] [phone]

## 2021-04-28 ENCOUNTER — Other Ambulatory Visit: Payer: Self-pay

## 2021-04-28 ENCOUNTER — Encounter: Payer: Self-pay | Admitting: Physician Assistant

## 2021-04-28 ENCOUNTER — Ambulatory Visit (INDEPENDENT_AMBULATORY_CARE_PROVIDER_SITE_OTHER): Payer: BC Managed Care – PPO | Admitting: Physician Assistant

## 2021-04-28 VITALS — BP 118/70 | HR 76 | Temp 97.5°F | Ht 61.25 in | Wt 162.4 lb

## 2021-04-28 DIAGNOSIS — R635 Abnormal weight gain: Secondary | ICD-10-CM | POA: Diagnosis not present

## 2021-04-28 DIAGNOSIS — Z6830 Body mass index (BMI) 30.0-30.9, adult: Secondary | ICD-10-CM

## 2021-04-28 MED ORDER — WEGOVY 0.25 MG/0.5ML ~~LOC~~ SOAJ: 0.2500 mg | SUBCUTANEOUS | 0 refills | Status: DC

## 2021-04-28 NOTE — Progress Notes (Signed)
Subjective:  Patient ID: Lisa Cantrell, female    DOB: 18-Apr-1970  Age: 51 y.o. MRN: [id number]  Chief Complaint  Patient presents with   Weight Gain    HPI  Pt in today with complaints of weight gain.  She states she is trying to watch her diet and is walking for exercise.  She has increased her water intake and decreased sodas Current Outpatient Medications on File Prior to Visit  Medication Sig Dispense Refill   buPROPion (WELLBUTRIN XL) 150 MG 24 hr tablet      estradiol (VIVELLE-DOT) 0.075 MG/24HR Dotti 0.075 mg/24 hr transdermal patch     folic acid (FOLVITE) 1 MG tablet Take 1 mg by mouth daily.     methotrexate 2.5 MG tablet Take 15 mg by mouth once a week.     progesterone (PROMETRIUM) 100 MG capsule Take 100 mg by mouth daily.     tiZANidine (ZANAFLEX) 4 MG tablet Take 1 tablet (4 mg total) by mouth every 8 (eight) hours as needed for muscle spasms. 30 tablet 1   No current facility-administered medications on file prior to visit.   Past Medical History:  Diagnosis Date   Allergy    Past Surgical History:  Procedure Laterality Date   EYE SURGERY      Family History  Problem Relation Age of Onset   Hypertension Other    Diabetes Other    Prostate cancer Other    Social History   Socioeconomic History   Marital status: Married    Spouse name: Not on file   Number of children: 2   Years of education: Not on file   Highest education level: Not on file  Occupational History   Occupation: self employed  Tobacco Use   Smoking status: Never   Smokeless tobacco: Never  Vaping Use   Vaping Use: Never used  Substance and Sexual Activity   Alcohol use: Never   Drug use: Never   Sexual activity: Not on file  Other Topics Concern   Not on file  Social History Narrative   Not on file   Social Determinants of Health   Financial Resource Strain: Not on file  Food Insecurity: Not on file  Transportation Needs: Not on file  Physical Activity: Not on file   Stress: Not on file  Social Connections: Not on file    Review of Systems CONSTITUTIONAL: see HPI  CARDIOVASCULAR: Negative for chest pain, dizziness, palpitations and pedal edema.  RESPIRATORY: Negative for recent cough and dyspnea.  GASTROINTESTINAL: Negative for abdominal pain, acid reflux symptoms, constipation, diarrhea, nausea and vomiting.     Objective:  BP 118/70 (BP Location: Left Arm, Patient Position: Sitting, Cuff Size: Normal)    Pulse 76    Temp (!) 97.5 F (36.4 C) (Temporal)    Ht 5' 1.25" (1.556 m)    Wt 162 lb 6.4 oz (73.7 kg)    SpO2 97%    BMI 30.44 kg/m   BP/Weight 04/28/2021 10/06/2020 11/19/2019  Systolic BP 118 120 120  Diastolic BP 70 74 74  Wt. (Lbs) 162.4 166.8 176  BMI 30.44 31.01 32.72  PHYSICAL EXAM:   VS: BP 118/70 (BP Location: Left Arm, Patient Position: Sitting, Cuff Size: Normal)    Pulse 76    Temp (!) 97.5 F (36.4 C) (Temporal)    Ht 5' 1.25" (1.556 m)    Wt 162 lb 6.4 oz (73.7 kg)    SpO2 97%    BMI 30.44 kg/m  GEN: Well nourished, well developed, in no acute distress  Cardiac: RRR; no murmurs, rubs, or gallops,no edema -  Respiratory:  normal respiratory rate and pattern with no distress - normal breath sounds with no rales, rhonchi, wheezes or rubs Psych: euthymic mood, appropriate affect and demeanor   Physical Exam  Diabetic Foot Exam - Simple   No data filed      Lab Results  Component Value Date   WBC 5.1 11/19/2019   HGB 12.5 11/19/2019   HCT 38.4 11/19/2019   PLT 253 11/19/2019   GLUCOSE 98 11/19/2019   CHOL 214 (H) 11/19/2019   TRIG 175 (H) 11/19/2019   HDL 72 11/19/2019   LDLCALC 112 (H) 11/19/2019   ALT 17 11/19/2019   AST 17 11/19/2019   NA 141 11/19/2019   K 4.3 11/19/2019   CL 105 11/19/2019   CREATININE 0.85 11/19/2019   BUN 19 11/19/2019   CO2 22 11/19/2019   TSH 1.420 11/19/2019      Assessment & Plan:   Problem List Items Addressed This Visit   None Visit Diagnoses     Body mass index  (BMI) 30.0-30.9, adult    -  Primary   Relevant Medications   Semaglutide-Weight Management (WEGOVY) 0.25 MG/0.5ML SOAJ   Weight gain       Relevant Orders   TSH     .  Meds ordered this encounter  Medications   Semaglutide-Weight Management (WEGOVY) 0.25 MG/0.5ML SOAJ    Sig: Inject 0.25 mg into the skin once a week.    Dispense:  2 mL    Refill:  0    Order Specific Question:   Supervising Provider    AnswerCorey Harold    Orders Placed This Encounter  Procedures   TSH     Follow-up: Return in about 6 weeks (around 06/09/2021) for follow up.  An After Visit Summary was printed and given to the patient.  Jettie Pagan [facility] [phone]

## 2021-04-29 LAB — TSH: TSH: 0.74 u[IU]/mL (ref 0.450–4.500)

## 2021-05-13 ENCOUNTER — Other Ambulatory Visit: Payer: Self-pay | Admitting: Physician Assistant

## 2021-05-13 DIAGNOSIS — Z6830 Body mass index (BMI) 30.0-30.9, adult: Secondary | ICD-10-CM

## 2021-05-13 MED ORDER — PHENTERMINE HCL 37.5 MG PO CAPS: 37.5000 mg | ORAL_CAPSULE | ORAL | 0 refills | Status: DC

## 2021-06-09 ENCOUNTER — Ambulatory Visit (INDEPENDENT_AMBULATORY_CARE_PROVIDER_SITE_OTHER): Payer: BC Managed Care – PPO | Admitting: Physician Assistant

## 2021-06-09 ENCOUNTER — Encounter: Payer: Self-pay | Admitting: Physician Assistant

## 2021-06-09 VITALS — BP 116/70 | HR 68 | Resp 18 | Ht 61.25 in | Wt 151.4 lb

## 2021-06-09 DIAGNOSIS — Z6830 Body mass index (BMI) 30.0-30.9, adult: Secondary | ICD-10-CM | POA: Diagnosis not present

## 2021-06-09 DIAGNOSIS — J3089 Other allergic rhinitis: Secondary | ICD-10-CM

## 2021-06-09 MED ORDER — ALBUTEROL SULFATE HFA 108 (90 BASE) MCG/ACT IN AERS: 2.0000 | INHALATION_SPRAY | Freq: Four times a day (QID) | RESPIRATORY_TRACT | 2 refills | Status: DC | PRN

## 2021-06-09 MED ORDER — PHENTERMINE HCL 37.5 MG PO CAPS: 37.5000 mg | ORAL_CAPSULE | ORAL | 1 refills | Status: DC

## 2021-06-09 NOTE — Progress Notes (Signed)
? ?Subjective:  ?Patient ID: Lisa Cantrell, female    DOB: 25-Dec-1970  Age: 51 y.o. MRN: [id number] ? ?Chief Complaint  ?Patient presents with  ? weight management  ? ? ?HPI ? Pt with complaints of allergy symptoms and requests albuterol inhaler to be refilled that she uses as needed - she is also taking otc allergy medication ? ?Pt here for follow up of weight management after being on adipex for the past month - she has lost 11 pounds and doing well on medication ?Current Outpatient Medications on File Prior to Visit  ?Medication Sig Dispense Refill  ? methotrexate (RHEUMATREX) 2.5 MG tablet Take by mouth.    ? buPROPion (WELLBUTRIN XL) 150 MG 24 hr tablet     ? estradiol (VIVELLE-DOT) 0.075 MG/24HR Dotti 0.075 mg/24 hr transdermal patch    ? folic acid (FOLVITE) 1 MG tablet Take 1 mg by mouth daily.    ? progesterone (PROMETRIUM) 100 MG capsule Take 100 mg by mouth daily.    ? Semaglutide-Weight Management (WEGOVY) 0.25 MG/0.5ML SOAJ Inject 0.25 mg into the skin once a week. 2 mL 0  ? tiZANidine (ZANAFLEX) 4 MG tablet Take 1 tablet (4 mg total) by mouth every 8 (eight) hours as needed for muscle spasms. 30 tablet 1  ? ?No current facility-administered medications on file prior to visit.  ? ?Past Medical History:  ?Diagnosis Date  ? Allergy   ? ?Past Surgical History:  ?Procedure Laterality Date  ? EYE SURGERY    ?  ?Family History  ?Problem Relation Age of Onset  ? Hypertension Other   ? Diabetes Other   ? Prostate cancer Other   ? ?Social History  ? ?Socioeconomic History  ? Marital status: Married  ?  Spouse name: Not on file  ? Number of children: 2  ? Years of education: Not on file  ? Highest education level: Not on file  ?Occupational History  ? Occupation: self employed  ?Tobacco Use  ? Smoking status: Never  ? Smokeless tobacco: Never  ?Vaping Use  ? Vaping Use: Never used  ?Substance and Sexual Activity  ? Alcohol use: Never  ? Drug use: Never  ? Sexual activity: Not on file  ?Other Topics Concern  ?  Not on file  ?Social History Narrative  ? Not on file  ? ?Social Determinants of Health  ? ?Financial Resource Strain: Not on file  ?Food Insecurity: Not on file  ?Transportation Needs: Not on file  ?Physical Activity: Not on file  ?Stress: Not on file  ?Social Connections: Not on file  ? ? ?Review of Systems ?CONSTITUTIONAL: Negative for chills, fatigue, fever, unintentional weight gain and unintentional weight loss.  ?CARDIOVASCULAR: Negative for chest pain, dizziness, palpitations and pedal edema.  ?RESPIRATORY: Negative for recent cough and dyspnea.  ?INTEGUMENTARY: Negative for rash.  ? ? ? ?Objective:  ?BP 116/70   Pulse 68   Resp 18   Ht 5' 1.25" (1.556 m)   Wt 151 lb 6.4 oz (68.7 kg)   SpO2 99%   BMI 28.37 kg/m?  ? ? ?  06/09/2021  ? 10:04 AM 04/28/2021  ?  3:19 PM 10/06/2020  ?  2:15 PM  ?BP/Weight  ?Systolic BP 116 118 120  ?Diastolic BP 70 70 74  ?Wt. (Lbs) 151.4 162.4 166.8  ?BMI 28.37 kg/m2 30.44 kg/m2 31.01 kg/m2  ? ? ?Physical Exam ?PHYSICAL EXAM:  ? ?VS: BP 116/70   Pulse 68   Resp 18   Ht 5'  1.25" (1.556 m)   Wt 151 lb 6.4 oz (68.7 kg)   SpO2 99%   BMI 28.37 kg/m?  ? ?GEN: Well nourished, well developed, in no acute distress  ?Cardiac: RRR; no murmurs,  ?Respiratory:  normal respiratory rate and pattern with no distress - normal breath sounds with no rales, rhonchi, wheezes or rubs ?Psych: euthymic mood, appropriate affect and demeanor ? ?Diabetic Foot Exam - Simple   ?No data filed ?  ?  ? ?Lab Results  ?Component Value Date  ? WBC 5.1 11/19/2019  ? HGB 12.5 11/19/2019  ? HCT 38.4 11/19/2019  ? PLT 253 11/19/2019  ? GLUCOSE 98 11/19/2019  ? CHOL 214 (H) 11/19/2019  ? TRIG 175 (H) 11/19/2019  ? HDL 72 11/19/2019  ? LDLCALC 112 (H) 11/19/2019  ? ALT 17 11/19/2019  ? AST 17 11/19/2019  ? NA 141 11/19/2019  ? K 4.3 11/19/2019  ? CL 105 11/19/2019  ? CREATININE 0.85 11/19/2019  ? BUN 19 11/19/2019  ? CO2 22 11/19/2019  ? TSH 0.740 04/28/2021  ? ? ? ? ?Assessment & Plan:  ? ?Problem List Items  Addressed This Visit   ?None ?Visit Diagnoses   ? ? Seasonal allergic rhinitis due to other allergic trigger    -  Primary  ? Relevant Medications  ? albuterol (VENTOLIN HFA) 108 (90 Base) MCG/ACT inhaler  ? Body mass index (BMI) 30.0-30.9, adult      ? Relevant Medications  ? phentermine 37.5 MG capsule  ? ?  ?. ? ?Meds ordered this encounter  ?Medications  ? albuterol (VENTOLIN HFA) 108 (90 Base) MCG/ACT inhaler  ?  Sig: Inhale 2 puffs into the lungs every 6 (six) hours as needed for wheezing or shortness of breath.  ?  Dispense:  8 g  ?  Refill:  2  ?  Order Specific Question:   Supervising Provider  ?  AnswerBlane Ohara [[id number]]  ? phentermine 37.5 MG capsule  ?  Sig: Take 1 capsule (37.5 mg total) by mouth every morning.  ?  Dispense:  30 capsule  ?  Refill:  1  ?  Order Specific Question:   Supervising Provider  ?  AnswerBlane Ohara [[id number]]  ? ? ?No orders of the defined types were placed in this encounter. ?  ? ?Follow-up: Return in about 2 months (around 08/09/2021) for fasting physical. ? ?An After Visit Summary was printed and given to the patient. ? ?SARA R Arling Cerone, PA-C ?[facility] ?[phone]

## 2021-08-13 ENCOUNTER — Other Ambulatory Visit: Payer: Self-pay | Admitting: Physician Assistant

## 2021-08-13 DIAGNOSIS — Z6830 Body mass index (BMI) 30.0-30.9, adult: Secondary | ICD-10-CM

## 2021-08-17 ENCOUNTER — Other Ambulatory Visit: Payer: Self-pay

## 2021-08-17 DIAGNOSIS — Z0000 Encounter for general adult medical examination without abnormal findings: Secondary | ICD-10-CM

## 2021-08-18 ENCOUNTER — Other Ambulatory Visit: Payer: Self-pay

## 2021-08-18 ENCOUNTER — Other Ambulatory Visit: Payer: BC Managed Care – PPO

## 2021-08-18 DIAGNOSIS — Z0000 Encounter for general adult medical examination without abnormal findings: Secondary | ICD-10-CM

## 2021-08-19 LAB — CBC WITH DIFFERENTIAL/PLATELET
Basophils Absolute: 0 10*3/uL (ref 0.0–0.2)
Basos: 1 %
EOS (ABSOLUTE): 0.1 10*3/uL (ref 0.0–0.4)
Eos: 3 %
Hematocrit: 34.7 % (ref 34.0–46.6)
Hemoglobin: 11.8 g/dL (ref 11.1–15.9)
Immature Grans (Abs): 0 10*3/uL (ref 0.0–0.1)
Immature Granulocytes: 0 %
Lymphocytes Absolute: 1.7 10*3/uL (ref 0.7–3.1)
Lymphs: 41 %
MCH: 31.4 pg (ref 26.6–33.0)
MCHC: 34 g/dL (ref 31.5–35.7)
MCV: 92 fL (ref 79–97)
Monocytes Absolute: 0.3 10*3/uL (ref 0.1–0.9)
Monocytes: 8 %
Neutrophils Absolute: 1.9 10*3/uL (ref 1.4–7.0)
Neutrophils: 47 %
Platelets: 241 10*3/uL (ref 150–450)
RBC: 3.76 x10E6/uL — ABNORMAL LOW (ref 3.77–5.28)
RDW: 14.4 % (ref 11.7–15.4)
WBC: 4.1 10*3/uL (ref 3.4–10.8)

## 2021-08-19 LAB — COMPREHENSIVE METABOLIC PANEL
ALT: 17 IU/L (ref 0–32)
AST: 12 IU/L (ref 0–40)
Albumin/Globulin Ratio: 2.1 (ref 1.2–2.2)
Albumin: 4.2 g/dL (ref 3.8–4.9)
Alkaline Phosphatase: 52 IU/L (ref 44–121)
BUN/Creatinine Ratio: 16 (ref 9–23)
BUN: 14 mg/dL (ref 6–24)
Bilirubin Total: 0.4 mg/dL (ref 0.0–1.2)
CO2: 21 mmol/L (ref 20–29)
Calcium: 8.9 mg/dL (ref 8.7–10.2)
Chloride: 105 mmol/L (ref 96–106)
Creatinine, Ser: 0.9 mg/dL (ref 0.57–1.00)
Globulin, Total: 2 g/dL (ref 1.5–4.5)
Glucose: 85 mg/dL (ref 70–99)
Potassium: 4.2 mmol/L (ref 3.5–5.2)
Sodium: 140 mmol/L (ref 134–144)
Total Protein: 6.2 g/dL (ref 6.0–8.5)
eGFR: 77 mL/min/{1.73_m2} (ref >59)

## 2021-08-19 LAB — LIPID PANEL
Chol/HDL Ratio: 2.5 ratio (ref 0.0–4.4)
Cholesterol, Total: 181 mg/dL (ref 100–199)
HDL: 72 mg/dL (ref >39)
LDL Chol Calc (NIH): 91 mg/dL (ref 0–99)
Triglycerides: 103 mg/dL (ref 0–149)
VLDL Cholesterol Cal: 18 mg/dL (ref 5–40)

## 2021-08-19 LAB — TSH: TSH: 1.4 u[IU]/mL (ref 0.450–4.500)

## 2021-08-19 LAB — CARDIOVASCULAR RISK ASSESSMENT

## 2021-08-25 ENCOUNTER — Encounter: Payer: Self-pay | Admitting: Physician Assistant

## 2021-08-25 ENCOUNTER — Other Ambulatory Visit: Payer: Self-pay | Admitting: Physician Assistant

## 2021-08-25 ENCOUNTER — Ambulatory Visit (INDEPENDENT_AMBULATORY_CARE_PROVIDER_SITE_OTHER): Payer: BC Managed Care – PPO | Admitting: Physician Assistant

## 2021-08-25 VITALS — BP 108/74 | HR 81 | Resp 18 | Ht 61.25 in | Wt 152.2 lb

## 2021-08-25 DIAGNOSIS — Z6830 Body mass index (BMI) 30.0-30.9, adult: Secondary | ICD-10-CM

## 2021-08-25 DIAGNOSIS — R82998 Other abnormal findings in urine: Secondary | ICD-10-CM

## 2021-08-25 DIAGNOSIS — Z0000 Encounter for general adult medical examination without abnormal findings: Secondary | ICD-10-CM

## 2021-08-25 LAB — POCT URINALYSIS DIP (CLINITEK)
Bilirubin, UA: NEGATIVE
Blood, UA: NEGATIVE
Glucose, UA: NEGATIVE mg/dL
Ketones, POC UA: NEGATIVE mg/dL
Nitrite, UA: NEGATIVE
POC PROTEIN,UA: NEGATIVE
Spec Grav, UA: 1.015 (ref 1.010–1.025)
Urobilinogen, UA: 0.2 E.U./dL
pH, UA: 6 (ref 5.0–8.0)

## 2021-08-25 MED ORDER — WEGOVY 0.25 MG/0.5ML ~~LOC~~ SOAJ: 0.2500 mg | SUBCUTANEOUS | 0 refills | Status: DC

## 2021-08-25 NOTE — Progress Notes (Signed)
Subjective:  Patient ID: Lisa Cantrell, female    DOB: Mar 10, 1970  Age: 51 y.o. MRN: [id number]  Chief Complaint  Patient presents with   Annual Exam    HPI Well Adult Physical: Patient here for a comprehensive physical exam.The patient reports no problems Do you take any herbs or supplements that were not prescribed by a doctor? no Are you taking calcium supplements? no Are you taking aspirin daily? No Pt had been taking adipex but has now changed insurance and would like to see if wegovy is covered and try that instead  Encounter for general adult medical examination without abnormal findings  Physical ("At Risk" items are starred): Patient's last physical exam was 1 year ago .  Patient is not afflicted from Stress Incontinence and Urge Incontinence  Patient wears a seat belt, has smoke detectors, has carbon monoxide detectors, practices appropriate gun safety, and wears sunscreen with extended sun exposure. Dental Care: biannual cleanings, brushes and flosses daily. Ophthalmology/Optometry: overdue Hearing loss: none Vision impairments: none   LMP: about 2 years ago Last mammogram normal 4/23 Due for colonoscopy but defers at this time [employer] Visit from 10/06/2020 in [facility]  PHQ-2 Total Score 0               Social Hx   Social History   Socioeconomic History   Marital status: Married    Spouse name: Not on file   Number of children: 2   Years of education: Not on file   Highest education level: Not on file  Occupational History   Occupation: self employed  Tobacco Use   Smoking status: Never   Smokeless tobacco: Never  Vaping Use   Vaping Use: Never used  Substance and Sexual Activity   Alcohol use: Never   Drug use: Never   Sexual activity: Not on file  Other Topics Concern   Not on file  Social History Narrative   Not on file   Social Determinants of Health   Financial Resource Strain: Not on file  Food Insecurity: Not  on file  Transportation Needs: Not on file  Physical Activity: Not on file  Stress: Not on file  Social Connections: Not on file   Past Medical History:  Diagnosis Date   Allergy    Past Surgical History:  Procedure Laterality Date   EYE SURGERY      Family History  Problem Relation Age of Onset   Hypertension Other    Diabetes Other    Prostate cancer Other     Review of Systems CONSTITUTIONAL: Negative for chills, fatigue, fever, unintentional weight gain and unintentional weight loss.  E/N/T: Negative for ear pain, nasal congestion and sore throat.  CARDIOVASCULAR: Negative for chest pain, dizziness, palpitations and pedal edema.  RESPIRATORY: Negative for recent cough and dyspnea.  GASTROINTESTINAL: Negative for abdominal pain, acid reflux symptoms, constipation, diarrhea, nausea and vomiting.  MSK: Negative for arthralgias and myalgias.  INTEGUMENTARY: Negative for rash.  NEUROLOGICAL: Negative for dizziness and headaches.  PSYCHIATRIC: Negative for sleep disturbance and to question depression screen.  Negative for depression, negative for anhedonia.       Objective:  PHYSICAL EXAM:   VS: BP 108/74   Pulse 81   Resp 18   Ht 5' 1.25" (1.556 m)   Wt 152 lb 3.2 oz (69 kg)   SpO2 98%   BMI 28.52 kg/m   GEN: Well nourished, well developed, in no acute distress  HEENT: normal external  ears and nose - normal external auditory canals and TMS - hearing grossly normal - - Lips, Teeth and Gums - normal  Oropharynx - normal mucosa, palate, and posterior pharynx Cardiac: RRR; no murmurs, rubs, or gallops,no edema -  Respiratory:  normal respiratory rate and pattern with no distress - normal breath sounds with no rales, rhonchi, wheezes or rubs GI: normal bowel sounds, no masses or tenderness MS: no deformity or atrophy  Skin: warm and dry, no rash  Neuro:  Alert and Oriented x 3, Strength and sensation are intact - CN II-Xii grossly intact Psych: euthymic mood,  appropriate affect and demeanor  Office Visit on 08/25/2021  Component Date Value Ref Range Status   Color, UA 08/25/2021 yellow  yellow Final   Clarity, UA 08/25/2021 clear  clear Final   Glucose, UA 08/25/2021 negative  negative mg/dL Final   Bilirubin, UA 19/14/7829 negative  negative Final   Ketones, POC UA 08/25/2021 negative  negative mg/dL Final   Spec Grav, UA 56/21/3086 1.015  1.010 - 1.025 Final   Blood, UA 08/25/2021 negative  negative Final   pH, UA 08/25/2021 6.0  5.0 - 8.0 Final   POC PROTEIN,UA 08/25/2021 negative  negative, trace Final   Urobilinogen, UA 08/25/2021 0.2  0.2 or 1.0 E.U./dL Final   Nitrite, UA 57/84/6962 Negative  Negative Final   Leukocytes, UA 08/25/2021 Moderate (2+) (A)  Negative Final     Lab Results  Component Value Date   WBC 4.1 08/18/2021   HGB 11.8 08/18/2021   HCT 34.7 08/18/2021   PLT 241 08/18/2021   GLUCOSE 85 08/18/2021   CHOL 181 08/18/2021   TRIG 103 08/18/2021   HDL 72 08/18/2021   LDLCALC 91 08/18/2021   ALT 17 08/18/2021   AST 12 08/18/2021   NA 140 08/18/2021   K 4.2 08/18/2021   CL 105 08/18/2021   CREATININE 0.90 08/18/2021   BUN 14 08/18/2021   CO2 21 08/18/2021   TSH 1.400 08/18/2021      Assessment & Plan:   Problem List Items Addressed This Visit       Other   Annual physical exam - Primary   Relevant Orders   POCT URINALYSIS DIP (CLINITEK)   Other Visit Diagnoses     Leukocytes in urine       Relevant Orders   Urine Culture   Body mass index (BMI) 30.0-30.9, adult       Relevant Medications   Semaglutide-Weight Management (WEGOVY) 0.25 MG/0.5ML SOAJ         Body mass index is 28.52 kg/m.   These are the goals we discussed:  Goals   None      This is a list of the screening recommended for you and due dates:  Health Maintenance  Topic Date Due   Colon Cancer Screening  06/10/2022*   Flu Shot  10/06/2021   Mammogram  06/09/2022   Pap Smear  03/18/2023   Tetanus Vaccine   11/18/2029   HPV Vaccine  Aged Out   COVID-19 Vaccine  Discontinued   Hepatitis C Screening: USPSTF Recommendation to screen - Ages 18-79 yo.  Discontinued   HIV Screening  Discontinued   Zoster (Shingles) Vaccine  Discontinued  *Topic was postponed. The date shown is not the original due date.     Meds ordered this encounter  Medications   Semaglutide-Weight Management (WEGOVY) 0.25 MG/0.5ML SOAJ    Sig: Inject 0.25 mg into the skin once a week.  Dispense:  2 mL    Refill:  0    Order Specific Question:   Supervising Provider    Answer:   Corey Harold    Follow-up: Return if symptoms worsen or fail to improve.  An After Visit Summary was printed and given to the patient.  Jettie Pagan [facility] [phone]

## 2021-08-27 ENCOUNTER — Telehealth: Payer: Self-pay

## 2021-08-27 NOTE — Telephone Encounter (Signed)
PATIENT HAS BEEN APPROVED THROUGH INSURANCE FOR WEGOVY FOR 08/26/21 TO 03/28/2022.

## 2021-08-28 ENCOUNTER — Other Ambulatory Visit: Payer: Self-pay | Admitting: Physician Assistant

## 2021-08-28 DIAGNOSIS — N3000 Acute cystitis without hematuria: Secondary | ICD-10-CM

## 2021-08-28 LAB — URINE CULTURE

## 2021-08-28 MED ORDER — NITROFURANTOIN MONOHYD MACRO 100 MG PO CAPS: 100.0000 mg | ORAL_CAPSULE | Freq: Two times a day (BID) | ORAL | 0 refills | Status: DC

## 2021-09-14 ENCOUNTER — Ambulatory Visit (INDEPENDENT_AMBULATORY_CARE_PROVIDER_SITE_OTHER): Payer: BC Managed Care – PPO

## 2021-09-14 DIAGNOSIS — N3000 Acute cystitis without hematuria: Secondary | ICD-10-CM | POA: Diagnosis not present

## 2021-09-14 LAB — POCT URINALYSIS DIP (CLINITEK)
Bilirubin, UA: NEGATIVE
Blood, UA: NEGATIVE
Glucose, UA: NEGATIVE mg/dL
Ketones, POC UA: NEGATIVE mg/dL
Leukocytes, UA: NEGATIVE
Nitrite, UA: NEGATIVE
Spec Grav, UA: 1.02 (ref 1.010–1.025)
Urobilinogen, UA: 0.2 E.U./dL
pH, UA: 6 (ref 5.0–8.0)

## 2021-09-14 NOTE — Progress Notes (Signed)
Patient came in for repeat UA due to an acute Acute cystitis.   Provider made aware of UA results.

## 2021-09-27 ENCOUNTER — Other Ambulatory Visit: Payer: Self-pay | Admitting: Physician Assistant

## 2021-09-27 DIAGNOSIS — Z6830 Body mass index (BMI) 30.0-30.9, adult: Secondary | ICD-10-CM

## 2021-09-28 ENCOUNTER — Other Ambulatory Visit: Payer: Self-pay | Admitting: Physician Assistant

## 2021-09-28 DIAGNOSIS — Z6830 Body mass index (BMI) 30.0-30.9, adult: Secondary | ICD-10-CM

## 2021-09-28 MED ORDER — WEGOVY 0.5 MG/0.5ML ~~LOC~~ SOAJ: 0.5000 mg | SUBCUTANEOUS | 0 refills | Status: DC

## 2021-10-06 ENCOUNTER — Ambulatory Visit: Payer: BC Managed Care – PPO | Admitting: Physician Assistant

## 2021-10-30 ENCOUNTER — Other Ambulatory Visit: Payer: Self-pay

## 2021-10-30 MED ORDER — NITROFURANTOIN MONOHYD MACRO 100 MG PO CAPS: 100.0000 mg | ORAL_CAPSULE | Freq: Two times a day (BID) | ORAL | 0 refills | Status: DC

## 2021-11-02 ENCOUNTER — Ambulatory Visit: Payer: BC Managed Care – PPO | Admitting: Physician Assistant

## 2021-11-02 ENCOUNTER — Encounter: Payer: Self-pay | Admitting: Physician Assistant

## 2021-11-02 VITALS — BP 106/68 | HR 88 | Resp 18 | Ht 61.25 in | Wt 152.0 lb

## 2021-11-02 DIAGNOSIS — N3001 Acute cystitis with hematuria: Secondary | ICD-10-CM | POA: Diagnosis not present

## 2021-11-02 DIAGNOSIS — Z6830 Body mass index (BMI) 30.0-30.9, adult: Secondary | ICD-10-CM | POA: Insufficient documentation

## 2021-11-02 LAB — POCT URINALYSIS DIP (CLINITEK)
Bilirubin, UA: NEGATIVE
Glucose, UA: NEGATIVE mg/dL
Nitrite, UA: NEGATIVE
Spec Grav, UA: 1.025 (ref 1.010–1.025)
Urobilinogen, UA: 0.2 E.U./dL
pH, UA: 5.5 (ref 5.0–8.0)

## 2021-11-02 MED ORDER — WEGOVY 1 MG/0.5ML ~~LOC~~ SOAJ: 1.0000 mg | SUBCUTANEOUS | 0 refills | Status: DC

## 2021-11-02 NOTE — Progress Notes (Signed)
Subjective:  Patient ID: Lisa Cantrell, female    DOB: 03/30/1970  Age: 51 y.o. MRN: [id number]  Chief Complaint  Patient presents with   Weight Management Screening    HPI  Pt in to follow up about uti - she began having urinary urgency and dysuria a few days ago - had rx sent in for macrobid on call over weekend - states is still having some mild symptoms  Pt in for follow up of wegovy - pt has not lost any weight this past month - is tolerating medication well and voices no GI complaints Current Outpatient Medications on File Prior to Visit  Medication Sig Dispense Refill   albuterol (VENTOLIN HFA) 108 (90 Base) MCG/ACT inhaler Inhale 2 puffs into the lungs every 6 (six) hours as needed for wheezing or shortness of breath. 8 g 2   buPROPion (WELLBUTRIN XL) 150 MG 24 hr tablet      estradiol (VIVELLE-DOT) 0.075 MG/24HR Dotti 0.075 mg/24 hr transdermal patch     folic acid (FOLVITE) 1 MG tablet Take 1 mg by mouth daily.     methotrexate (RHEUMATREX) 2.5 MG tablet Take by mouth.     nitrofurantoin, macrocrystal-monohydrate, (MACROBID) 100 MG capsule Take 1 capsule (100 mg total) by mouth 2 (two) times daily. 20 capsule 0   progesterone (PROMETRIUM) 100 MG capsule Take 100 mg by mouth daily.     tiZANidine (ZANAFLEX) 4 MG tablet Take 1 tablet (4 mg total) by mouth every 8 (eight) hours as needed for muscle spasms. 30 tablet 1   No current facility-administered medications on file prior to visit.   Past Medical History:  Diagnosis Date   Allergy    Past Surgical History:  Procedure Laterality Date   EYE SURGERY      Family History  Problem Relation Age of Onset   Hypertension Other    Diabetes Other    Prostate cancer Other    Social History   Socioeconomic History   Marital status: Married    Spouse name: Not on file   Number of children: 2   Years of education: Not on file   Highest education level: Not on file  Occupational History   Occupation: self employed   Tobacco Use   Smoking status: Never   Smokeless tobacco: Never  Vaping Use   Vaping Use: Never used  Substance and Sexual Activity   Alcohol use: Never   Drug use: Never   Sexual activity: Not on file  Other Topics Concern   Not on file  Social History Narrative   Not on file   Social Determinants of Health   Financial Resource Strain: Not on file  Food Insecurity: Not on file  Transportation Needs: Not on file  Physical Activity: Not on file  Stress: Not on file  Social Connections: Not on file    Review of Systems  CONSTITUTIONAL: Negative for chills, fatigue, fever, CARDIOVASCULAR: Negative for chest pain, dizziness, palpitations and pedal edema.  RESPIRATORY: Negative for recent cough and dyspnea.  GASTROINTESTINAL: Negative for abdominal pain, acid reflux symptoms, constipation, diarrhea, nausea and vomiting.  GU - see HPI Objective:  PHYSICAL EXAM:   VS: BP 106/68   Pulse 88   Resp 18   Ht 5' 1.25" (1.556 m)   Wt 152 lb (68.9 kg)   SpO2 98%   BMI 28.49 kg/m   GEN: Well nourished, well developed, in no acute distress   Cardiac: RRR; no murmurs,  Respiratory:  normal  respiratory rate and pattern with no distress - normal breath sounds with no rales, rhonchi, wheezes or rubs  Skin: warm and dry, no rash   Psych: euthymic mood, appropriate affect and demeanor  Office Visit on 11/02/2021  Component Date Value Ref Range Status   Color, UA 11/02/2021 yellow  yellow Final   Clarity, UA 11/02/2021 clear  clear Final   Glucose, UA 11/02/2021 negative  negative mg/dL Final   Bilirubin, UA 86/76/1950 negative  negative Final   Ketones, POC UA 11/02/2021 trace (5) (A)  negative mg/dL Final   Spec Grav, UA 93/26/7124 1.025  1.010 - 1.025 Final   Blood, UA 11/02/2021 trace-intact (A)  negative Final   pH, UA 11/02/2021 5.5  5.0 - 8.0 Final   POC PROTEIN,UA 11/02/2021 trace  negative, trace Final   Urobilinogen, UA 11/02/2021 0.2  0.2 or 1.0 E.U./dL Final    Nitrite, UA 58/11/9831 Negative  Negative Final   Leukocytes, UA 11/02/2021 Trace (A)  Negative Final    Lab Results  Component Value Date   WBC 4.1 08/18/2021   HGB 11.8 08/18/2021   HCT 34.7 08/18/2021   PLT 241 08/18/2021   GLUCOSE 85 08/18/2021   CHOL 181 08/18/2021   TRIG 103 08/18/2021   HDL 72 08/18/2021   LDLCALC 91 08/18/2021   ALT 17 08/18/2021   AST 12 08/18/2021   NA 140 08/18/2021   K 4.2 08/18/2021   CL 105 08/18/2021   CREATININE 0.90 08/18/2021   BUN 14 08/18/2021   CO2 21 08/18/2021   TSH 1.400 08/18/2021      Assessment & Plan:   Problem List Items Addressed This Visit       Genitourinary   Acute hemorrhagic cystitis - Primary   Relevant Orders   Urine Culture   POCT URINALYSIS DIP (CLINITEK) Continue macrobid     Other   Body mass index (BMI) 30.0-30.9, adult Watch diet Increase wegovy to 1mg  weekly  .  Meds ordered this encounter  Medications   Semaglutide-Weight Management (WEGOVY) 1 MG/0.5ML SOAJ    Sig: Inject 1 mg into the skin once a week.    Dispense:  2 mL    Refill:  0    Order Specific Question:   Supervising Provider    Answer    Orders Placed This Encounter  Procedures   Urine Culture   POCT URINALYSIS DIP (CLINITEK)     Follow-up: Return in about 3 months (around 02/02/2022) for follow up.  An After Visit Summary was printed and given to the patient.  02/04/2022 [facility] [phone]

## 2021-11-03 LAB — URINE CULTURE

## 2021-11-16 ENCOUNTER — Other Ambulatory Visit: Payer: Self-pay

## 2021-11-16 MED ORDER — WEGOVY 1 MG/0.5ML ~~LOC~~ SOAJ: 1.0000 mg | SUBCUTANEOUS | 0 refills | Status: DC

## 2021-11-27 ENCOUNTER — Other Ambulatory Visit: Payer: Self-pay | Admitting: Physician Assistant

## 2021-11-27 ENCOUNTER — Telehealth: Payer: Self-pay

## 2021-11-27 DIAGNOSIS — Z6830 Body mass index (BMI) 30.0-30.9, adult: Secondary | ICD-10-CM

## 2021-11-27 MED ORDER — WEGOVY 1.7 MG/0.75ML ~~LOC~~ SOAJ: 1.7000 mg | SUBCUTANEOUS | 0 refills | Status: DC

## 2021-11-27 NOTE — Telephone Encounter (Signed)
Patient was informed.

## 2021-11-27 NOTE — Telephone Encounter (Signed)
Will increase dose and will send to pharmacy

## 2021-11-27 NOTE — Telephone Encounter (Signed)
Patient needs a refill on wegovy and she asked if she will continue with 1 mg or you will increase the dose. Can you please send a refill to [facility] in [place].

## 2021-12-01 ENCOUNTER — Encounter: Payer: Self-pay | Admitting: Physician Assistant

## 2021-12-01 ENCOUNTER — Ambulatory Visit: Payer: BC Managed Care – PPO | Admitting: Physician Assistant

## 2021-12-01 ENCOUNTER — Other Ambulatory Visit: Payer: Self-pay | Admitting: Physician Assistant

## 2021-12-01 VITALS — BP 104/68 | HR 90 | Temp 97.5°F | Ht 61.25 in | Wt 149.0 lb

## 2021-12-01 DIAGNOSIS — J060 Acute laryngopharyngitis: Secondary | ICD-10-CM | POA: Diagnosis not present

## 2021-12-01 DIAGNOSIS — J3089 Other allergic rhinitis: Secondary | ICD-10-CM

## 2021-12-01 MED ORDER — AZITHROMYCIN 250 MG PO TABS: ORAL_TABLET | ORAL | 0 refills | Status: AC

## 2021-12-01 MED ORDER — TRIAMCINOLONE ACETONIDE 40 MG/ML IJ SUSP
60.0000 mg | Freq: Once | INTRAMUSCULAR | Status: AC
  Administered 2021-12-01: 60 mg via INTRAMUSCULAR

## 2021-12-01 NOTE — Progress Notes (Signed)
Acute Office Visit  Subjective:    Patient ID: Lisa Cantrell, female    DOB: [birth year], 51 y.o.   MRN: [id number]  Chief Complaint  Patient presents with   Sinusitis    HPI: Patient is in today for complaints of sinus congestion, sore throat and cough for the past 10 days - states that she has not had a fever - did take a home COVID test today that was negative  Past Medical History:  Diagnosis Date   Allergy     Past Surgical History:  Procedure Laterality Date   EYE SURGERY      Family History  Problem Relation Age of Onset   Hypertension Other    Diabetes Other    Prostate cancer Other     Social History   Socioeconomic History   Marital status: Married    Spouse name: Not on file   Number of children: 2   Years of education: Not on file   Highest education level: Not on file  Occupational History   Occupation: self employed  Tobacco Use   Smoking status: Never   Smokeless tobacco: Never  Vaping Use   Vaping Use: Never used  Substance and Sexual Activity   Alcohol use: Never   Drug use: Never   Sexual activity: Not on file  Other Topics Concern   Not on file  Social History Narrative   Not on file   Social Determinants of Health   Financial Resource Strain: Not on file  Food Insecurity: Not on file  Transportation Needs: Not on file  Physical Activity: Not on file  Stress: Not on file  Social Connections: Not on file  Intimate Partner Violence: Not on file    Outpatient Medications Prior to Visit  Medication Sig Dispense Refill   albuterol (VENTOLIN HFA) 108 (90 Base) MCG/ACT inhaler Inhale 2 puffs into the lungs every 6 (six) hours as needed for wheezing or shortness of breath. 8 g 2   buPROPion (WELLBUTRIN XL) 150 MG 24 hr tablet      estradiol (VIVELLE-DOT) 0.075 MG/24HR Dotti 0.075 mg/24 hr transdermal patch     folic acid (FOLVITE) 1 MG tablet Take 1 mg by mouth daily.     methotrexate (RHEUMATREX) 2.5 MG tablet Take by mouth.      progesterone (PROMETRIUM) 100 MG capsule Take 100 mg by mouth daily.     Semaglutide-Weight Management (WEGOVY) 1.7 MG/0.75ML SOAJ Inject 1.7 mg into the skin once a week. 3 mL 0   tiZANidine (ZANAFLEX) 4 MG tablet Take 1 tablet (4 mg total) by mouth every 8 (eight) hours as needed for muscle spasms. 30 tablet 1   nitrofurantoin, macrocrystal-monohydrate, (MACROBID) 100 MG capsule Take 1 capsule (100 mg total) by mouth 2 (two) times daily. 20 capsule 0   No facility-administered medications prior to visit.    Allergies  Allergen Reactions   Hydromorphone Hcl Itching   Penicillins Hives   Hydromorphone Rash   Penicillin G Rash    Review of Systems CONSTITUTIONAL: Negative for chills, fatigue, fever, E/N/T: see HPI CARDIOVASCULAR: Negative for chest pain, dizziness, RESPIRATORY: see HPI      Objective:    PHYSICAL EXAM:   VS: BP 104/68 (BP Location: Left Arm, Patient Position: Sitting, Cuff Size: Normal)   Pulse 90   Temp (!) 97.5 F (36.4 C) (Temporal)   Ht 5' 1.25" (1.556 m)   Wt 149 lb (67.6 kg)   SpO2 97%   BMI 27.92 kg/m  GEN: Well nourished, well developed, in no acute distress  HEENT: normal external ears and nose - normal external auditory canals and TMS -  - Lips, Teeth and Gums - normal  Oropharynx - moderate erythema/pnd  Cardiac: RRR; no murmurs, Respiratory:  normal respiratory rate and pattern with no distress - normal breath sounds with no rales, rhonchi, wheezes or rubs   Health Maintenance Due  Topic Date Due   INFLUENZA VACCINE  10/06/2021    There are no preventive care reminders to display for this patient.   Lab Results  Component Value Date   TSH 1.400 08/18/2021   Lab Results  Component Value Date   WBC 4.1 08/18/2021   HGB 11.8 08/18/2021   HCT 34.7 08/18/2021   MCV 92 08/18/2021   PLT 241 08/18/2021   Lab Results  Component Value Date   NA 140 08/18/2021   K 4.2 08/18/2021   CO2 21 08/18/2021   GLUCOSE 85 08/18/2021   BUN  14 08/18/2021   CREATININE 0.90 08/18/2021   BILITOT 0.4 08/18/2021   ALKPHOS 52 08/18/2021   AST 12 08/18/2021   ALT 17 08/18/2021   PROT 6.2 08/18/2021   ALBUMIN 4.2 08/18/2021   CALCIUM 8.9 08/18/2021   EGFR 77 08/18/2021   Lab Results  Component Value Date   CHOL 181 08/18/2021   Lab Results  Component Value Date   HDL 72 08/18/2021   Lab Results  Component Value Date   LDLCALC 91 08/18/2021   Lab Results  Component Value Date   TRIG 103 08/18/2021   Lab Results  Component Value Date   CHOLHDL 2.5 08/18/2021   No results found for: "HGBA1C"     Assessment & Plan:   Problem List Items Addressed This Visit   None Visit Diagnoses     Acute laryngopharyngitis    -  Primary   Relevant Medications   triamcinolone acetonide (KENALOG-40) injection 60 mg (Completed) (Start on 12/01/2021  3:15 PM)   azithromycin (ZITHROMAX) 250 MG tablet      Meds ordered this encounter  Medications   triamcinolone acetonide (KENALOG-40) injection 60 mg   azithromycin (ZITHROMAX) 250 MG tablet    Sig: Take 2 tablets on day 1, then 1 tablet daily on days 2 through 5    Dispense:  6 tablet    Refill:  0    Order Specific Question:   Supervising Provider    Answer:   Rochel Brome [id number]    No orders of the defined types were placed in this encounter.    Follow-up: Return if symptoms worsen or fail to improve.  An After Visit Summary was printed and given to the patient.  [name] [phone]

## 2021-12-29 ENCOUNTER — Telehealth: Payer: Self-pay

## 2021-12-29 ENCOUNTER — Other Ambulatory Visit: Payer: Self-pay | Admitting: Physician Assistant

## 2021-12-29 MED ORDER — WEGOVY 2.4 MG/0.75ML ~~LOC~~ SOAJ: 2.4000 mg | SUBCUTANEOUS | 1 refills | Status: DC

## 2021-12-29 NOTE — Telephone Encounter (Signed)
Patient has appointment 02/03/22

## 2021-12-29 NOTE — Telephone Encounter (Signed)
Will send in wegovy 2.4 --- make follow up appt in one month

## 2021-12-29 NOTE — Telephone Encounter (Signed)
Patient called stating that she has finished the month dose of the 1.7 of the wegovy and states that she is tolerating it well. She is wanting to know if you can send in the next dose to [facility].

## 2022-01-22 ENCOUNTER — Other Ambulatory Visit: Payer: Self-pay

## 2022-01-22 MED ORDER — WEGOVY 2.4 MG/0.75ML ~~LOC~~ SOAJ: 2.4000 mg | SUBCUTANEOUS | 1 refills | Status: DC

## 2022-02-03 ENCOUNTER — Encounter: Payer: Self-pay | Admitting: Physician Assistant

## 2022-02-03 ENCOUNTER — Ambulatory Visit: Payer: BC Managed Care – PPO | Admitting: Physician Assistant

## 2022-02-03 VITALS — BP 108/70 | HR 80 | Temp 96.9°F | Ht 65.0 in | Wt 137.6 lb

## 2022-02-03 DIAGNOSIS — Z23 Encounter for immunization: Secondary | ICD-10-CM

## 2022-02-03 DIAGNOSIS — J309 Allergic rhinitis, unspecified: Secondary | ICD-10-CM | POA: Insufficient documentation

## 2022-02-03 DIAGNOSIS — J3089 Other allergic rhinitis: Secondary | ICD-10-CM | POA: Diagnosis not present

## 2022-02-03 DIAGNOSIS — R635 Abnormal weight gain: Secondary | ICD-10-CM | POA: Diagnosis not present

## 2022-02-03 NOTE — Progress Notes (Signed)
Subjective:  Patient ID: Lisa Cantrell, female    DOB: 02-Jul-1970  Age: 51 y.o. MRN: [id number]  Chief Complaint  Patient presents with   Weight Gain    HPI  Pt with history of allergies - states she is stable on ventolin as needed, otc antihistamines and also uses otc nexium bid  Pt with history of weight gain.  She has been taking wegovy for several months and has lost 12 pounds since last visit.  She is currently on 2.4mg  weekly dosing and tolerating medication well.  States she does have nausea the first 24 hours of being on medication She would like to continue with medication at this time but will be tapering off soon  Pt would like flu vaccine Current Outpatient Medications on File Prior to Visit  Medication Sig Dispense Refill   albuterol (VENTOLIN HFA) 108 (90 Base) MCG/ACT inhaler INHALE 2 PUFFS INTO THE LUNGS EVERY 6 HOURS AS NEEDED FOR WHEEZING OR SHORTNESS OF BREATH 6.7 g 2   buPROPion (WELLBUTRIN XL) 150 MG 24 hr tablet      estradiol (VIVELLE-DOT) 0.075 MG/24HR Dotti 0.075 mg/24 hr transdermal patch     folic acid (FOLVITE) 1 MG tablet Take 1 mg by mouth daily.     methotrexate (RHEUMATREX) 2.5 MG tablet Take by mouth.     progesterone (PROMETRIUM) 100 MG capsule Take 100 mg by mouth daily.     Semaglutide-Weight Management (WEGOVY) 2.4 MG/0.75ML SOAJ Inject 2.4 mg into the skin once a week. 3 mL 1   tiZANidine (ZANAFLEX) 4 MG tablet Take 1 tablet (4 mg total) by mouth every 8 (eight) hours as needed for muscle spasms. 30 tablet 1   No current facility-administered medications on file prior to visit.   Past Medical History:  Diagnosis Date   Allergy    Past Surgical History:  Procedure Laterality Date   EYE SURGERY      Family History  Problem Relation Age of Onset   Hypertension Other    Diabetes Other    Prostate cancer Other    Social History   Socioeconomic History   Marital status: Married    Spouse name: Not on file   Number of children: 2    Years of education: Not on file   Highest education level: Not on file  Occupational History   Occupation: self employed  Tobacco Use   Smoking status: Never   Smokeless tobacco: Never  Vaping Use   Vaping Use: Never used  Substance and Sexual Activity   Alcohol use: Never   Drug use: Never   Sexual activity: Not on file  Other Topics Concern   Not on file  Social History Narrative   Not on file   Social Determinants of Health   Financial Resource Strain: Not on file  Food Insecurity: Not on file  Transportation Needs: Not on file  Physical Activity: Not on file  Stress: Not on file  Social Connections: Not on file    Review of Systems CONSTITUTIONAL: Negative for chills, fatigue, fever,   CARDIOVASCULAR: Negative for chest pain,  RESPIRATORY: Negative for recent cough and dyspnea.  GASTROINTESTINAL: see HPI       Objective:  PHYSICAL EXAM:   VS: BP 108/70 (BP Location: Left Arm, Patient Position: Sitting, Cuff Size: Large)   Pulse 80   Temp (!) 96.9 F (36.1 C) (Temporal)   Ht 5\' 5"  (1.651 m)   Wt 137 lb 9.6 oz (62.4 kg)   SpO2  98%   BMI 22.90 kg/m   GEN: Well nourished, well developed, in no acute distress  Cardiac: RRR; no murmurs,  Respiratory:  normal respiratory rate and pattern with no distress - normal breath sounds with no rales, rhonchi, wheezes or rubs  Psych: euthymic mood, appropriate affect and demeanor     Lab Results  Component Value Date   WBC 4.1 08/18/2021   HGB 11.8 08/18/2021   HCT 34.7 08/18/2021   PLT 241 08/18/2021   GLUCOSE 85 08/18/2021   CHOL 181 08/18/2021   TRIG 103 08/18/2021   HDL 72 08/18/2021   LDLCALC 91 08/18/2021   ALT 17 08/18/2021   AST 12 08/18/2021   NA 140 08/18/2021   K 4.2 08/18/2021   CL 105 08/18/2021   CREATININE 0.90 08/18/2021   BUN 14 08/18/2021   CO2 21 08/18/2021   TSH 1.400 08/18/2021      Assessment & Plan:   Problem List Items Addressed This Visit       Respiratory   Allergic  rhinitis due to allergen - Primary Continue meds     Other   Weight gain Continue to watch diet and continue current dose of wegovy   Other Visit Diagnoses     Needs flu shot       Relevant Orders   Flu Vaccine MDCK QUAD PF     .  No orders of the defined types were placed in this encounter.   Orders Placed This Encounter  Procedures   Flu Vaccine MDCK QUAD PF     Follow-up: Return in about 3 months (around 05/06/2022) for follow up.  An After Visit Summary was printed and given to the patient.  Jettie Pagan [facility] [phone]

## 2022-03-21 ENCOUNTER — Other Ambulatory Visit: Payer: Self-pay | Admitting: Physician Assistant

## 2022-04-14 ENCOUNTER — Telehealth: Payer: Self-pay

## 2022-04-14 NOTE — Telephone Encounter (Signed)
[facility] faxed notice of approval of Wegovy 2.4 mg from 04/14/2022 to 04/15/2023.

## 2022-05-10 ENCOUNTER — Ambulatory Visit: Payer: BC Managed Care – PPO | Admitting: Physician Assistant

## 2022-05-10 ENCOUNTER — Encounter: Payer: Self-pay | Admitting: Physician Assistant

## 2022-05-10 VITALS — BP 98/68 | HR 75 | Temp 97.8°F | Ht 65.0 in | Wt 138.0 lb

## 2022-05-10 DIAGNOSIS — R635 Abnormal weight gain: Secondary | ICD-10-CM | POA: Diagnosis not present

## 2022-05-10 DIAGNOSIS — J3089 Other allergic rhinitis: Secondary | ICD-10-CM

## 2022-05-10 NOTE — Progress Notes (Signed)
Established Patient Office Visit  Subjective:  Patient ID: Lisa Cantrell, female    DOB: 1970-06-09  Age: 52 y.o. MRN: [id number]  CC:  Chief Complaint  Patient presents with   Seasonal allergies   Weight loss    HPI Lisa Cantrell presents for follow up  She has seasonal allergies but currently doing well - takes otc meds and uses ventolin as needed  Pt is currently on wegovy for weight loss therapy - she has remained stable and doing well on medication.  Is watching diet as well  Past Medical History:  Diagnosis Date   Allergy     Past Surgical History:  Procedure Laterality Date   EYE SURGERY      Family History  Problem Relation Age of Onset   Hypertension Other    Diabetes Other    Prostate cancer Other     Social History   Socioeconomic History   Marital status: Married    Spouse name: Not on file   Number of children: 2   Years of education: Not on file   Highest education level: Not on file  Occupational History   Occupation: self employed  Tobacco Use   Smoking status: Never   Smokeless tobacco: Never  Vaping Use   Vaping Use: Never used  Substance and Sexual Activity   Alcohol use: Never   Drug use: Never   Sexual activity: Not on file  Other Topics Concern   Not on file  Social History Narrative   Not on file   Social Determinants of Health   Financial Resource Strain: Not on file  Food Insecurity: Not on file  Transportation Needs: Not on file  Physical Activity: Not on file  Stress: Not on file  Social Connections: Not on file  Intimate Partner Violence: Not on file     Current Outpatient Medications:    albuterol (VENTOLIN HFA) 108 (90 Base) MCG/ACT inhaler, INHALE 2 PUFFS INTO THE LUNGS EVERY 6 HOURS AS NEEDED FOR WHEEZING OR SHORTNESS OF BREATH, Disp: 6.7 g, Rfl: 2   buPROPion (WELLBUTRIN XL) 150 MG 24 hr tablet, , Disp: , Rfl:    estradiol (VIVELLE-DOT) 0.075 MG/24HR, Dotti 0.075 mg/24 hr transdermal patch, Disp: ,  Rfl:    folic acid (FOLVITE) 1 MG tablet, Take 1 mg by mouth daily., Disp: , Rfl:    methotrexate (RHEUMATREX) 2.5 MG tablet, Take by mouth., Disp: , Rfl:    ondansetron (ZOFRAN) 4 MG tablet, Take 4 mg by mouth every 6 (six) hours as needed for nausea or vomiting., Disp: , Rfl:    progesterone (PROMETRIUM) 100 MG capsule, Take 100 mg by mouth daily., Disp: , Rfl:    WEGOVY 2.4 MG/0.75ML SOAJ, ADMINISTER 2.4 MG UNDER THE SKIN 1 TIME A WEEK, Disp: 3 mL, Rfl: 1   Allergies  Allergen Reactions   Hydromorphone Hcl Itching   Penicillins Hives   Hydromorphone Rash   Penicillin G Rash    ROS CONSTITUTIONAL: Negative for chills, fatigue, fever,  E/N/T: Negative for ear pain, nasal congestion and sore throat.  CARDIOVASCULAR: Negative for chest pain, dizziness,  RESPIRATORY: Negative for recent cough and dyspnea.      Objective:    PHYSICAL EXAM:   VS: BP 98/68 (BP Location: Left Arm, Patient Position: Sitting)   Pulse 75   Temp 97.8 F (36.6 C) (Temporal)   Ht '5\' 5"'$  (1.651 m)   Wt 138 lb (62.6 kg)   SpO2 100%   BMI 22.96  kg/m   GEN: Well nourished, well developed, in no acute distress  Cardiac: RRR; no murmurs, rubs, or gallops,no edema - Respiratory:  normal respiratory rate and pattern with no distress - normal breath sounds with no rales, rhonchi, wheezes or rubs Psych: euthymic mood, appropriate affect and demeanor      There are no preventive care reminders to display for this patient.  There are no preventive care reminders to display for this patient.  Lab Results  Component Value Date   TSH 1.400 08/18/2021   Lab Results  Component Value Date   WBC 4.1 08/18/2021   HGB 11.8 08/18/2021   HCT 34.7 08/18/2021   MCV 92 08/18/2021   PLT 241 08/18/2021   Lab Results  Component Value Date   NA 140 08/18/2021   K 4.2 08/18/2021   CO2 21 08/18/2021   GLUCOSE 85 08/18/2021   BUN 14 08/18/2021   CREATININE 0.90 08/18/2021   BILITOT 0.4 08/18/2021   ALKPHOS  52 08/18/2021   AST 12 08/18/2021   ALT 17 08/18/2021   PROT 6.2 08/18/2021   ALBUMIN 4.2 08/18/2021   CALCIUM 8.9 08/18/2021   EGFR 77 08/18/2021   Lab Results  Component Value Date   CHOL 181 08/18/2021   Lab Results  Component Value Date   HDL 72 08/18/2021   Lab Results  Component Value Date   LDLCALC 91 08/18/2021   Lab Results  Component Value Date   TRIG 103 08/18/2021   Lab Results  Component Value Date   CHOLHDL 2.5 08/18/2021   No results found for: "HGBA1C"    Assessment & Plan:   Problem List Items Addressed This Visit       Respiratory   Allergic rhinitis due to allergen - Primary Continue current meds     Other   Weight gain Continue wegovy Continue low fat diet    No orders of the defined types were placed in this encounter.   Follow-up: Return for fasting physical in July.    SARA R Imraan Wendell, PA-C

## 2022-05-15 ENCOUNTER — Other Ambulatory Visit: Payer: Self-pay | Admitting: Physician Assistant

## 2022-07-22 ENCOUNTER — Other Ambulatory Visit: Payer: Self-pay | Admitting: Physician Assistant

## 2022-07-22 DIAGNOSIS — J3089 Other allergic rhinitis: Secondary | ICD-10-CM

## 2022-09-07 ENCOUNTER — Encounter: Payer: Self-pay | Admitting: Physician Assistant

## 2022-09-07 ENCOUNTER — Ambulatory Visit (INDEPENDENT_AMBULATORY_CARE_PROVIDER_SITE_OTHER): Payer: BC Managed Care – PPO | Admitting: Physician Assistant

## 2022-09-07 VITALS — BP 98/66 | HR 77 | Temp 97.0°F | Ht 61.5 in | Wt 132.2 lb

## 2022-09-07 DIAGNOSIS — Z0000 Encounter for general adult medical examination without abnormal findings: Secondary | ICD-10-CM

## 2022-09-07 DIAGNOSIS — M069 Rheumatoid arthritis, unspecified: Secondary | ICD-10-CM | POA: Diagnosis not present

## 2022-09-07 DIAGNOSIS — Z131 Encounter for screening for diabetes mellitus: Secondary | ICD-10-CM | POA: Diagnosis not present

## 2022-09-07 LAB — POCT URINALYSIS DIP (CLINITEK)
Bilirubin, UA: NEGATIVE
Blood, UA: NEGATIVE
Glucose, UA: NEGATIVE mg/dL
Ketones, POC UA: NEGATIVE mg/dL
Leukocytes, UA: NEGATIVE
Nitrite, UA: NEGATIVE
POC PROTEIN,UA: NEGATIVE
Spec Grav, UA: 1.01 (ref 1.010–1.025)
Urobilinogen, UA: 0.2 E.U./dL
pH, UA: 5.5 (ref 5.0–8.0)

## 2022-09-07 NOTE — Progress Notes (Signed)
Subjective:  Patient ID: Lisa Cantrell, female    DOB: 1970-04-03  Age: 52 y.o. MRN: [id number]  Chief Complaint  Patient presents with   Annual Exam    HPI Well Adult Physical: Patient here for a comprehensive physical exam.The patient reports  she was seen recently at urgent care and treated with muscle relaxant for low back pain - is improved Do you take any herbs or supplements that were not prescribed by a doctor? no Are you taking calcium supplements? no Are you taking aspirin daily? no  Encounter for general adult medical examination without abnormal findings  Physical ("At Risk" items are starred): Patient's last physical exam was 1 year ago .  Patient is not afflicted from Stress Incontinence and Urge Incontinence  Patient wears a seat belts Patient has smoke detectors and has carbon monoxide detectors. Patient wears sunscreen with extended sun exposure. Dental Care: brushes and flosses daily. Last dental visit: up to date Vision impairments: none Ophthalmology/Optometry: Annual visit.  Hearing loss: none   Pregnancy history: G2P2 Safe at home: Yes Self breast exams: Yes Last pap: has appt with GYN next month Last mammogram: has appt next month     09/07/2022    8:56 AM 05/10/2022    3:04 PM 02/03/2022    3:07 PM 10/06/2020    2:15 PM  Depression screen PHQ 2/9  Decreased Interest 0 0 0 0  Down, Depressed, Hopeless 0 0 0 0  PHQ - 2 Score 0 0 0 0  Altered sleeping 0 0 0   Tired, decreased energy 0  0   Change in appetite 0 1 0   Feeling bad or failure about yourself  0 0 0   Trouble concentrating 0 0 0   Moving slowly or fidgety/restless 0 0 0   Suicidal thoughts 0 0 0   PHQ-9 Score 0 1 0   Difficult doing work/chores Not difficult at all Not difficult at all Not difficult at all          10/06/2020    2:15 PM 05/10/2022    3:09 PM 09/07/2022    8:53 AM  Fall Risk  Falls in the past year? 0 0 0  Was there an injury with Fall? 0 0 0  Fall Risk Category  Calculator 0 0 0  Fall Risk Category (Retired) Low    (RETIRED) Patient Fall Risk Level Low fall risk    Patient at Risk for Falls Due to  No Fall Risks No Fall Risks  Fall risk Follow up  Falls evaluation completed Falls evaluation completed             Social Hx   Social History   Socioeconomic History   Marital status: Married    Spouse name: Not on file   Number of children: 2   Years of education: Not on file   Highest education level: Not on file  Occupational History   Occupation: self employed  Tobacco Use   Smoking status: Never   Smokeless tobacco: Never  Vaping Use   Vaping Use: Never used  Substance and Sexual Activity   Alcohol use: Never   Drug use: Never   Sexual activity: Not on file  Other Topics Concern   Not on file  Social History Narrative   Not on file   Social Determinants of Health   Financial Resource Strain: Low Risk  (09/07/2022)   Overall Financial Resource Strain (CARDIA)    Difficulty of Paying Living  Expenses: Not hard at all  Food Insecurity: No Food Insecurity (09/07/2022)   Hunger Vital Sign    Worried About Running Out of Food in the Last Year: Never true    Ran Out of Food in the Last Year: Never true  Transportation Needs: No Transportation Needs (09/07/2022)   PRAPARE - Administrator, Civil Service (Medical): No    Lack of Transportation (Non-Medical): No  Physical Activity: Sufficiently Active (09/07/2022)   Exercise Vital Sign    Days of Exercise per Week: 5 days    Minutes of Exercise per Session: 40 min  Stress: No Stress Concern Present (09/07/2022)   [employer] of Occupational Health - Occupational Stress Questionnaire    Feeling of Stress : Not at all  Social Connections: Socially Integrated (09/07/2022)   Social Connection and Isolation Panel [NHANES]    Frequency of Communication with Friends and Family: More than three times a week    Frequency of Social Gatherings with Friends and Family: More than  three times a week    Attends Religious Services: More than 4 times per year    Active Member of [employer] or Organizations: Yes    Attends Engineer, structural: More than 4 times per year    Marital Status: Married   Past Medical History:  Diagnosis Date   Allergy    Past Surgical History:  Procedure Laterality Date   EYE SURGERY      Family History  Problem Relation Age of Onset   Hypertension Other    Diabetes Other    Prostate cancer Other     ROS CONSTITUTIONAL: Negative for chills, fatigue, fever, unintentional weight gain and unintentional weight loss.  E/N/T: Negative for ear pain, nasal congestion and sore throat.  CARDIOVASCULAR: Negative for chest pain, dizziness, palpitations and pedal edema.  RESPIRATORY: Negative for recent cough and dyspnea.  GASTROINTESTINAL: Negative for abdominal pain, acid reflux symptoms, constipation, diarrhea, nausea and vomiting.  MSK: Negative for arthralgias and myalgias.  INTEGUMENTARY: Negative for rash.  NEUROLOGICAL: Negative for dizziness and headaches.  PSYCHIATRIC: Negative for sleep disturbance and to question depression screen.  Negative for depression, negative for anhedonia.   Objective:  PHYSICAL EXAM:   BP 98/66 (BP Location: Left Arm, Patient Position: Sitting, Cuff Size: Normal)   Pulse 77   Temp (!) 97 F (36.1 C) (Temporal)   Ht 5' 1.5" (1.562 m)   Wt 132 lb 3.2 oz (60 kg)   SpO2 99%   BMI 24.57 kg/m    GEN: Well nourished, well developed, in no acute distress  HEENT: normal external ears and nose - normal external auditory canals and TMS - hearing grossly normal - - Lips, Teeth and Gums - normal  Oropharynx - normal mucosa, palate, and posterior pharynx Neck: no JVD or masses - no thyromegaly Cardiac: RRR; no murmurs, rubs, or gallops,no edema - no significant varicosities Respiratory:  normal respiratory rate and pattern with no distress - normal breath sounds with no rales, rhonchi, wheezes or  rubs GI: normal bowel sounds, no masses or tenderness MS: no deformity or atrophy  Skin: warm and dry, no rash  Neuro:  Alert and Oriented x 3, Strength and sensation are intact - CN II-Xii grossly intact Psych: euthymic mood, appropriate affect and demeanor  Assessment & Plan:  Annual physical exam -     CBC with Differential/Platelet -     Comprehensive metabolic panel -     TSH -  Lipid panel -     VITAMIN D 25 Hydroxy (Vit-D Deficiency, Fractures) -     Hemoglobin A1c -     POCT URINALYSIS DIP (CLINITEK)  Screening for diabetes mellitus -     Hemoglobin A1c  Rheumatoid arthritis, involving unspecified site, unspecified whether rheumatoid factor present (HCC) -     VITAMIN D 25 Hydroxy (Vit-D Deficiency, Fractures)    This is a list of the screening recommended for you and due dates:  Health Maintenance  Topic Date Due   Colon Cancer Screening  Never done   Mammogram  06/09/2022   Flu Shot  10/07/2022   Pap Smear  03/18/2023   DTaP/Tdap/Td vaccine (2 - Td or Tdap) 11/18/2029   HPV Vaccine  Aged Out   COVID-19 Vaccine  Discontinued   Hepatitis C Screening  Discontinued   HIV Screening  Discontinued   Zoster (Shingles) Vaccine  Discontinued     Follow-up: Return in about 1 year (around 09/07/2023) for fasting physical - 20 min.  An After Visit Summary was printed and given to the patient.  Jettie Pagan [facility] [phone]

## 2022-09-08 LAB — LIPID PANEL
Chol/HDL Ratio: 2.6 ratio (ref 0.0–4.4)
Cholesterol, Total: 178 mg/dL (ref 100–199)
HDL: 68 mg/dL (ref >39)
LDL Chol Calc (NIH): 89 mg/dL (ref 0–99)
Triglycerides: 119 mg/dL (ref 0–149)
VLDL Cholesterol Cal: 21 mg/dL (ref 5–40)

## 2022-09-08 LAB — COMPREHENSIVE METABOLIC PANEL
ALT: 22 IU/L (ref 0–32)
AST: 14 IU/L (ref 0–40)
Albumin: 4.4 g/dL (ref 3.8–4.9)
Alkaline Phosphatase: 54 IU/L (ref 44–121)
BUN/Creatinine Ratio: 22 (ref 9–23)
BUN: 20 mg/dL (ref 6–24)
Bilirubin Total: 0.4 mg/dL (ref 0.0–1.2)
CO2: 23 mmol/L (ref 20–29)
Calcium: 9.2 mg/dL (ref 8.7–10.2)
Chloride: 104 mmol/L (ref 96–106)
Creatinine, Ser: 0.91 mg/dL (ref 0.57–1.00)
Globulin, Total: 2 g/dL (ref 1.5–4.5)
Glucose: 70 mg/dL (ref 70–99)
Potassium: 4.4 mmol/L (ref 3.5–5.2)
Sodium: 138 mmol/L (ref 134–144)
Total Protein: 6.4 g/dL (ref 6.0–8.5)
eGFR: 75 mL/min/{1.73_m2} (ref >59)

## 2022-09-08 LAB — CBC WITH DIFFERENTIAL/PLATELET
Basophils Absolute: 0.1 10*3/uL (ref 0.0–0.2)
Basos: 1 %
EOS (ABSOLUTE): 0.1 10*3/uL (ref 0.0–0.4)
Eos: 3 %
Hematocrit: 37.1 % (ref 34.0–46.6)
Hemoglobin: 13.1 g/dL (ref 11.1–15.9)
Immature Grans (Abs): 0 10*3/uL (ref 0.0–0.1)
Immature Granulocytes: 0 %
Lymphocytes Absolute: 2.1 10*3/uL (ref 0.7–3.1)
Lymphs: 40 %
MCH: 32.3 pg (ref 26.6–33.0)
MCHC: 35.3 g/dL (ref 31.5–35.7)
MCV: 92 fL (ref 79–97)
Monocytes Absolute: 0.5 10*3/uL (ref 0.1–0.9)
Monocytes: 10 %
Neutrophils Absolute: 2.4 10*3/uL (ref 1.4–7.0)
Neutrophils: 46 %
Platelets: 276 10*3/uL (ref 150–450)
RBC: 4.05 x10E6/uL (ref 3.77–5.28)
RDW: 12.6 % (ref 11.7–15.4)
WBC: 5.2 10*3/uL (ref 3.4–10.8)

## 2022-09-08 LAB — HEMOGLOBIN A1C
Est. average glucose Bld gHb Est-mCnc: 94 mg/dL
Hgb A1c MFr Bld: 4.9 % (ref 4.8–5.6)

## 2022-09-08 LAB — TSH: TSH: 1.07 u[IU]/mL (ref 0.450–4.500)

## 2022-09-08 LAB — VITAMIN D 25 HYDROXY (VIT D DEFICIENCY, FRACTURES): Vit D, 25-Hydroxy: 97.8 ng/mL (ref 30.0–100.0)

## 2022-11-02 ENCOUNTER — Telehealth: Payer: Self-pay

## 2022-11-02 NOTE — Telephone Encounter (Signed)
Patient asked if you have any suggestions of medicine. Her insurance stopped covered [place] and she gained 10 lbs. She cannot afford to pay for the medicine. Please advice.

## 2022-11-03 NOTE — Telephone Encounter (Signed)
At this point she is not a candidate for other prescription medication for weight loss because of her BMI and having lost quite a bit of weight --- her weight gain should slowly decrease after stopping wegovy

## 2022-11-03 NOTE — Telephone Encounter (Signed)
Called patient left detailed  message  °

## 2022-12-07 ENCOUNTER — Ambulatory Visit: Payer: BC Managed Care – PPO | Admitting: Physician Assistant

## 2022-12-07 ENCOUNTER — Encounter: Payer: Self-pay | Admitting: Physician Assistant

## 2022-12-07 VITALS — BP 98/66 | HR 69 | Temp 97.3°F | Ht 61.5 in | Wt 141.4 lb

## 2022-12-07 DIAGNOSIS — J069 Acute upper respiratory infection, unspecified: Secondary | ICD-10-CM | POA: Diagnosis not present

## 2022-12-07 LAB — POC COVID19 BINAXNOW: SARS Coronavirus 2 Ag: NEGATIVE

## 2022-12-07 MED ORDER — PREDNISONE 20 MG PO TABS
ORAL_TABLET | ORAL | 0 refills | Status: AC
Start: 2022-12-07 — End: 2022-12-15

## 2022-12-07 MED ORDER — DOXYCYCLINE HYCLATE 100 MG PO TBEC
100.0000 mg | DELAYED_RELEASE_TABLET | Freq: Two times a day (BID) | ORAL | 0 refills | Status: DC
Start: 2022-12-07 — End: 2022-12-08

## 2022-12-07 NOTE — Assessment & Plan Note (Signed)
Prescribed Doxycycline 100mg  and Prednisone Will continue to monitor symptoms If symptoms worsen or change she will let us know

## 2022-12-07 NOTE — Progress Notes (Signed)
Acute Office Visit  Subjective:    Patient ID: Lisa Cantrell, female    DOB: 1971/02/25, 52 y.o.   MRN: [id number]  Chief Complaint  Patient presents with   Sore Throat   Sinus Problem   Cough    HPI: Patient is in today for sinus congestion, denies fevers, chills, admits to popping in her ears a lot recently. Admits to some nausea but no vomiting. Admits to having a decreased appetite. Has been able to keep fluids down pretty well along with eating mostly yogurt and protein shakes. Admits to having seasonal allergies along with asthma. Has been taking xyzhal daily to try and help but it has not. Took mucinex but did not help much with the symptoms.   Upper respiratory symptoms She complains of congestion, cough described as productive of clear sputum, facial pain, and nasal congestion.with no fever, chills, night sweats or weight loss. Onset of symptoms was a few days ago and staying constant.She is drinking plenty of fluids.  Past history is significant for no history of pneumonia or bronchitis. Patient is non-smoker   Past Medical History:  Diagnosis Date   Allergy     Past Surgical History:  Procedure Laterality Date   EYE SURGERY      Family History  Problem Relation Age of Onset   Hypertension Other    Diabetes Other    Prostate cancer Other     Social History   Socioeconomic History   Marital status: Married    Spouse name: Not on file   Number of children: 2   Years of education: Not on file   Highest education level: Not on file  Occupational History   Occupation: self employed  Tobacco Use   Smoking status: Never   Smokeless tobacco: Never  Vaping Use   Vaping status: Never Used  Substance and Sexual Activity   Alcohol use: Never   Drug use: Never   Sexual activity: Not on file  Other Topics Concern   Not on file  Social History Narrative   Not on file   Social Determinants of Health   Financial Resource Strain: Low Risk  (09/07/2022)    Overall Financial Resource Strain (CARDIA)    Difficulty of Paying Living Expenses: Not hard at all  Food Insecurity: No Food Insecurity (09/07/2022)   Hunger Vital Sign    Worried About Running Out of Food in the Last Year: Never true    Ran Out of Food in the Last Year: Never true  Transportation Needs: No Transportation Needs (09/07/2022)   PRAPARE - Administrator, Civil Service (Medical): No    Lack of Transportation (Non-Medical): No  Physical Activity: Sufficiently Active (09/07/2022)   Exercise Vital Sign    Days of Exercise per Week: 5 days    Minutes of Exercise per Session: 40 min  Stress: No Stress Concern Present (09/07/2022)   [employer] of Occupational Health - Occupational Stress Questionnaire    Feeling of Stress : Not at all  Social Connections: Socially Integrated (09/07/2022)   Social Connection and Isolation Panel [NHANES]    Frequency of Communication with Friends and Family: More than three times a week    Frequency of Social Gatherings with Friends and Family: More than three times a week    Attends Religious Services: More than 4 times per year    Active Member of [employer] or Organizations: Yes    Attends Banker Meetings: More than 4 times  per year    Marital Status: Married  Catering manager Violence: Not At Risk (09/07/2022)   Humiliation, Afraid, Rape, and Kick questionnaire    Fear of Current or Ex-Partner: No    Emotionally Abused: No    Physically Abused: No    Sexually Abused: No    Outpatient Medications Prior to Visit  Medication Sig Dispense Refill   albuterol (VENTOLIN HFA) 108 (90 Base) MCG/ACT inhaler INHALE 2 PUFFS INTO THE LUNGS EVERY 6 HOURS AS NEEDED FOR WHEEZING OR SHORTNESS OF BREATH 6.7 g 2   buPROPion (WELLBUTRIN XL) 150 MG 24 hr tablet      estradiol (VIVELLE-DOT) 0.075 MG/24HR Dotti 0.075 mg/24 hr transdermal patch     folic acid (FOLVITE) 1 MG tablet Take 1 mg by mouth daily.     methocarbamol (ROBAXIN) 500 MG  tablet Take 500 mg by mouth 3 (three) times daily.     methotrexate (RHEUMATREX) 2.5 MG tablet Take by mouth.     ondansetron (ZOFRAN) 4 MG tablet Take 4 mg by mouth every 6 (six) hours as needed for nausea or vomiting.     progesterone (PROMETRIUM) 100 MG capsule Take 100 mg by mouth daily.     No facility-administered medications prior to visit.    Allergies  Allergen Reactions   Hydromorphone Hcl Itching   Penicillins Hives   Hydromorphone Rash   Penicillin G Rash    Review of Systems  Constitutional:  Negative for fatigue.  HENT:  Positive for congestion, postnasal drip, sinus pressure, sinus pain and sore throat. Negative for ear pain.   Respiratory:  Positive for cough. Negative for shortness of breath.   Cardiovascular:  Negative for chest pain.  Gastrointestinal:  Negative for abdominal pain, constipation, diarrhea, nausea and vomiting.  Genitourinary:  Negative for dysuria, frequency and urgency.  Musculoskeletal:  Negative for arthralgias, back pain and myalgias.  Neurological:  Negative for dizziness and headaches.  Psychiatric/Behavioral:  Negative for agitation and sleep disturbance. The patient is not nervous/anxious.        Objective:        12/07/2022    9:53 AM 09/07/2022    8:48 AM 05/10/2022    3:07 PM  Vitals with BMI  Height 5' 1.5" 5' 1.5" 5\' 5"   Weight 141 lbs 6 oz 132 lbs 3 oz 138 lbs  BMI 26.29 24.58 22.96  Systolic 98 98 98  Diastolic 66 66 68  Pulse 69 77 75    Orthostatic VS for the past 72 hrs (Last 3 readings):  Patient Position BP Location Cuff Size  12/07/22 0953 Sitting Left Arm Normal     Physical Exam Vitals reviewed.  Constitutional:      Appearance: Normal appearance.  HENT:     Right Ear: Hearing and ear canal normal. No tenderness. Tympanic membrane is bulging. Tympanic membrane is not erythematous.     Left Ear: Hearing and ear canal normal. No tenderness. Tympanic membrane is bulging. Tympanic membrane is not erythematous.   Neck:     Vascular: No carotid bruit.  Cardiovascular:     Rate and Rhythm: Normal rate and regular rhythm.     Heart sounds: Normal heart sounds.  Pulmonary:     Effort: Pulmonary effort is normal.     Breath sounds: Normal breath sounds.  Abdominal:     General: Bowel sounds are normal.     Palpations: Abdomen is soft.     Tenderness: There is no abdominal tenderness.  Neurological:  Mental Status: She is alert and oriented to person, place, and time.  Psychiatric:        Mood and Affect: Mood normal.        Behavior: Behavior normal.     Health Maintenance Due  Topic Date Due   Cervical Cancer Screening (HPV/Pap Cotest)  Never done   INFLUENZA VACCINE  10/07/2022    There are no preventive care reminders to display for this patient.   Lab Results  Component Value Date   TSH 1.070 09/07/2022   Lab Results  Component Value Date   WBC 5.2 09/07/2022   HGB 13.1 09/07/2022   HCT 37.1 09/07/2022   MCV 92 09/07/2022   PLT 276 09/07/2022   Lab Results  Component Value Date   NA 138 09/07/2022   K 4.4 09/07/2022   CO2 23 09/07/2022   GLUCOSE 70 09/07/2022   BUN 20 09/07/2022   CREATININE 0.91 09/07/2022   BILITOT 0.4 09/07/2022   ALKPHOS 54 09/07/2022   AST 14 09/07/2022   ALT 22 09/07/2022   PROT 6.4 09/07/2022   ALBUMIN 4.4 09/07/2022   CALCIUM 9.2 09/07/2022   EGFR 75 09/07/2022   Lab Results  Component Value Date   CHOL 178 09/07/2022   Lab Results  Component Value Date   HDL 68 09/07/2022   Lab Results  Component Value Date   LDLCALC 89 09/07/2022   Lab Results  Component Value Date   TRIG 119 09/07/2022   Lab Results  Component Value Date   CHOLHDL 2.6 09/07/2022   Lab Results  Component Value Date   HGBA1C 4.9 09/07/2022       Assessment & Plan:  Acute upper respiratory infection Assessment & Plan: Prescribed Doxycycline 100mg  and Prednisone Will continue to monitor symptoms If symptoms worsen or change she will let us  know  Orders: -     POC COVID-19 BinaxNow -     predniSONE; Take 3 tablets (60 mg total) by mouth daily with breakfast for 3 days, THEN 2 tablets (40 mg total) daily with breakfast for 3 days, THEN 1 tablet (20 mg total) daily with breakfast for 3 days.  Dispense: 18 tablet; Refill: 0 -     Doxycycline Hyclate; Take 1 tablet (100 mg total) by mouth 2 (two) times daily.  Dispense: 10 tablet; Refill: 0     Meds ordered this encounter  Medications   predniSONE (DELTASONE) 20 MG tablet    Sig: Take 3 tablets (60 mg total) by mouth daily with breakfast for 3 days, THEN 2 tablets (40 mg total) daily with breakfast for 3 days, THEN 1 tablet (20 mg total) daily with breakfast for 3 days.    Dispense:  18 tablet    Refill:  0   doxycycline (DORYX) 100 MG EC tablet    Sig: Take 1 tablet (100 mg total) by mouth 2 (two) times daily.    Dispense:  10 tablet    Refill:  0    Orders Placed This Encounter  Procedures   POC COVID-19     Follow-up: No follow-ups on file.  An After Visit Summary was printed and given to the patient.  Langley Gauss, [place] [facility] [phone]

## 2022-12-08 ENCOUNTER — Other Ambulatory Visit: Payer: Self-pay | Admitting: Physician Assistant

## 2022-12-08 DIAGNOSIS — J069 Acute upper respiratory infection, unspecified: Secondary | ICD-10-CM

## 2022-12-08 MED ORDER — DOXYCYCLINE HYCLATE 100 MG PO TBEC
100.0000 mg | DELAYED_RELEASE_TABLET | Freq: Two times a day (BID) | ORAL | 0 refills | Status: DC
Start: 2022-12-08 — End: 2023-07-11

## 2023-01-16 ENCOUNTER — Other Ambulatory Visit: Payer: Self-pay | Admitting: Family Medicine

## 2023-01-16 DIAGNOSIS — J3089 Other allergic rhinitis: Secondary | ICD-10-CM

## 2023-07-03 ENCOUNTER — Other Ambulatory Visit: Payer: Self-pay | Admitting: Physician Assistant

## 2023-07-03 DIAGNOSIS — J3089 Other allergic rhinitis: Secondary | ICD-10-CM

## 2023-07-11 ENCOUNTER — Encounter: Payer: Self-pay | Admitting: Physician Assistant

## 2023-07-11 ENCOUNTER — Ambulatory Visit: Payer: Self-pay | Admitting: Physician Assistant

## 2023-07-11 DIAGNOSIS — M069 Rheumatoid arthritis, unspecified: Secondary | ICD-10-CM | POA: Diagnosis not present

## 2023-07-11 MED ORDER — PHENTERMINE HCL 37.5 MG PO CAPS: 37.5000 mg | ORAL_CAPSULE | ORAL | 1 refills | Status: DC

## 2023-07-11 NOTE — Progress Notes (Signed)
 Subjective:  Patient ID: Lisa Cantrell, female    DOB: [birth year]  Age: 53 y.o. MRN: [id number]  Chief Complaint  Patient presents with   Weight Check    HPI Pt with RA - does see specialist and on methotrexate - has a follow up in August - states symptoms are stable  Pt has been on adipex in past for weight loss and would like to try medication again - is working on diet and exercise Denies chest pain/sob/edema     09/07/2022    8:56 AM 05/10/2022    3:04 PM 02/03/2022    3:07 PM 10/06/2020    2:15 PM  Depression screen PHQ 2/9  Decreased Interest 0 0 0 0  Down, Depressed, Hopeless 0 0 0 0  PHQ - 2 Score 0 0 0 0  Altered sleeping 0 0 0   Tired, decreased energy 0  0   Change in appetite 0 1 0   Feeling bad or failure about yourself  0 0 0   Trouble concentrating 0 0 0   Moving slowly or fidgety/restless 0 0 0   Suicidal thoughts 0 0 0   PHQ-9 Score 0 1 0   Difficult doing work/chores Not difficult at all Not difficult at all Not difficult at all         10/06/2020    2:15 PM 05/10/2022    3:09 PM 09/07/2022    8:53 AM  Fall Risk  Falls in the past year? 0 0 0  Was there an injury with Fall? 0 0 0  Fall Risk Category Calculator 0 0 0  Fall Risk Category (Retired) Low    (RETIRED) Patient Fall Risk Level Low fall risk    Patient at Risk for Falls Due to  No Fall Risks No Fall Risks  Fall risk Follow up  Falls evaluation completed Falls evaluation completed     ROS CONSTITUTIONAL: Negative for chills, fatigue, fever,  E/N/T: Negative for ear pain, nasal congestion and sore throat.  CARDIOVASCULAR: Negative for chest pain, dizziness, palpitations and pedal edema.  RESPIRATORY: Negative for recent cough and dyspnea.   PSYCHIATRIC: Negative for sleep disturbance and to question depression screen.  Negative for depression, negative for anhedonia.    Current Outpatient Medications:    albuterol  (VENTOLIN  HFA) 108 (90 Base) MCG/ACT inhaler, INHALE 2 PUFFS INTO THE LUNGS  EVERY 6 HOURS AS NEEDED FOR WHEEZING OR SHORTNESS OF BREATH, Disp: 6.7 g, Rfl: 2   buPROPion (WELLBUTRIN XL) 150 MG 24 hr tablet, , Disp: , Rfl:    estradiol (VIVELLE-DOT) 0.075 MG/24HR, Dotti 0.075 mg/24 hr transdermal patch, Disp: , Rfl:    folic acid (FOLVITE) 1 MG tablet, Take 1 mg by mouth daily., Disp: , Rfl:    methotrexate (RHEUMATREX) 2.5 MG tablet, Take by mouth., Disp: , Rfl:    ondansetron (ZOFRAN) 4 MG tablet, Take 4 mg by mouth every 6 (six) hours as needed for nausea or vomiting., Disp: , Rfl:    phentermine  37.5 MG capsule, Take 1 capsule (37.5 mg total) by mouth every morning., Disp: 30 capsule, Rfl: 1   progesterone (PROMETRIUM) 100 MG capsule, Take 100 mg by mouth daily., Disp: , Rfl:   Past Medical History:  Diagnosis Date   Allergy    Objective:  PHYSICAL EXAM:   BP 118/60   Pulse 80   Temp 97.9 F (36.6 C)   Resp 16   Ht 5' 1.5" (1.562 m)   Wt 152 lb (  68.9 kg)   LMP  (LMP Unknown)   SpO2 98%   BMI 28.26 kg/m    GEN: Well nourished, well developed, in no acute distress   Cardiac: RRR; no murmurs, rubs, or gallops,no edema - Respiratory:  normal respiratory rate and pattern with no distress - normal breath sounds with no rales, rhonchi, wheezes or rubs GI: normal bowel sounds, no masses or tenderness  Psych: euthymic mood, appropriate affect and demeanor  Assessment & Plan:    Morbid obesity (HCC) -     Phentermine  HCl; Take 1 capsule (37.5 mg total) by mouth every morning.  Dispense: 30 capsule; Refill: 1 Continue to watch diet/exercise Rheumatoid arthritis, involving unspecified site, unspecified whether rheumatoid factor present (HCC) Follow up with rheumatology as scheduled    Follow-up: Return in about 3 months (around 10/11/2023) for physical.  An After Visit Summary was printed and given to the patient.  Anthonette Bastos [facility] [phone]

## 2023-08-10 ENCOUNTER — Other Ambulatory Visit: Payer: Self-pay

## 2023-08-10 MED ORDER — PHENTERMINE HCL 37.5 MG PO CAPS: 37.5000 mg | ORAL_CAPSULE | ORAL | 1 refills | Status: DC

## 2023-08-15 ENCOUNTER — Telehealth: Payer: Self-pay

## 2023-08-15 NOTE — Telephone Encounter (Signed)
 Called patient made her aware that they are not longer doing wegovy  at [facility] per provider but she can get wegovy  for 200 dollars this month and than it will go back up to 500.

## 2023-09-16 ENCOUNTER — Other Ambulatory Visit: Payer: Self-pay | Admitting: Physician Assistant

## 2023-09-16 DIAGNOSIS — J3089 Other allergic rhinitis: Secondary | ICD-10-CM

## 2023-10-10 ENCOUNTER — Encounter: Admitting: Physician Assistant

## 2023-10-11 ENCOUNTER — Telehealth: Payer: Self-pay

## 2023-10-11 ENCOUNTER — Encounter: Payer: Self-pay | Admitting: Physician Assistant

## 2023-10-11 ENCOUNTER — Ambulatory Visit (INDEPENDENT_AMBULATORY_CARE_PROVIDER_SITE_OTHER): Admitting: Physician Assistant

## 2023-10-11 VITALS — BP 100/72 | HR 83 | Temp 97.5°F | Ht 61.5 in | Wt 146.8 lb

## 2023-10-11 DIAGNOSIS — J4520 Mild intermittent asthma, uncomplicated: Secondary | ICD-10-CM

## 2023-10-11 DIAGNOSIS — Z0000 Encounter for general adult medical examination without abnormal findings: Secondary | ICD-10-CM

## 2023-10-11 DIAGNOSIS — R3121 Asymptomatic microscopic hematuria: Secondary | ICD-10-CM | POA: Diagnosis not present

## 2023-10-11 DIAGNOSIS — Z1211 Encounter for screening for malignant neoplasm of colon: Secondary | ICD-10-CM

## 2023-10-11 LAB — POCT URINALYSIS DIP (CLINITEK)
Bilirubin, UA: NEGATIVE
Glucose, UA: NEGATIVE mg/dL
Ketones, POC UA: NEGATIVE mg/dL
Leukocytes, UA: NEGATIVE
Nitrite, UA: NEGATIVE
Spec Grav, UA: 1.025 (ref 1.010–1.025)
Urobilinogen, UA: 0.2 U/dL
pH, UA: 5.5 (ref 5.0–8.0)

## 2023-10-11 MED ORDER — FLUTICASONE-SALMETEROL 115-21 MCG/ACT IN AERO: 2.0000 | INHALATION_SPRAY | Freq: Two times a day (BID) | RESPIRATORY_TRACT | 12 refills | Status: AC

## 2023-10-11 NOTE — Telephone Encounter (Signed)
 Copied from CRM #[id number]. Topic: Clinical - Prescription Issue >> Oct 11, 2023  4:00 PM Delon T wrote: Reason for CRM: fluticasone -salmeterol (ADVAIR HFA) 115-21 MCG/ACT inhaler- copay too high, need something else that would be cheaper.

## 2023-10-11 NOTE — Progress Notes (Signed)
 Subjective:  Patient ID: Lisa Cantrell, female    DOB: 26-Feb-1971  Age: 53 y.o. MRN: [id number]  Chief Complaint  Patient presents with   Annual Exam    HPI Well Adult Physical: Patient here for a comprehensive physical exam.The patient reports she used to be on Qvar for asthma - would like to try a different maintenance inhaler Do you take any herbs or supplements that were not prescribed by a doctor? no Are you taking calcium supplements? no Are you taking aspirin daily? no  Encounter for general adult medical examination without abnormal findings  Physical (At Risk items are starred): Patient's last physical exam was 1 year ago .  Patient is not afflicted from Stress Incontinence and Urge Incontinence  Patient wears a seat belts Patient has smoke detectors and has carbon monoxide detectors. Patient practices appropriate gun safety. Patient wears sunscreen with extended sun exposure. Dental Care: brushes and flosses daily. Last dental visit: up to date Vision impairments: none Ophthalmology/Optometry: Annual visit.  Hearing loss: none  No LMP recorded (lmp unknown). Patient is postmenopausal. Pregnancy history: G2P2 Safe at home: Yes Self breast exams: Yes Last pap: going today Last mammogram: going today     10/11/2023    9:48 AM 09/07/2022    8:56 AM 05/10/2022    3:04 PM 02/03/2022    3:07 PM 10/06/2020    2:15 PM  Depression screen PHQ 2/9  Decreased Interest 0 0 0 0 0  Down, Depressed, Hopeless 0 0 0 0 0  PHQ - 2 Score 0 0 0 0 0  Altered sleeping  0 0 0   Tired, decreased energy  0  0   Change in appetite  0 1 0   Feeling bad or failure about yourself   0 0 0   Trouble concentrating  0 0 0   Moving slowly or fidgety/restless  0 0 0   Suicidal thoughts  0 0 0   PHQ-9 Score  0 1 0   Difficult doing work/chores  Not difficult at all Not difficult at all Not difficult at all          10/06/2020    2:15 PM 05/10/2022    3:09 PM 09/07/2022    8:53 AM 10/11/2023     9:48 AM  Fall Risk  Falls in the past year? 0 0 0 0  Was there an injury with Fall? 0 0 0 0  Fall Risk Category Calculator 0 0 0 0  Fall Risk Category (Retired) Low      (RETIRED) Patient Fall Risk Level Low fall risk      Patient at Risk for Falls Due to  No Fall Risks No Fall Risks No Fall Risks  Fall risk Follow up  Falls evaluation completed Falls evaluation completed Falls evaluation completed     Data saved with a previous flowsheet row definition             Social Hx   Social History   Socioeconomic History   Marital status: Married    Spouse name: Not on file   Number of children: 2   Years of education: Not on file   Highest education level: Not on file  Occupational History   Occupation: self employed  Tobacco Use   Smoking status: Never   Smokeless tobacco: Never  Vaping Use   Vaping status: Never Used  Substance and Sexual Activity   Alcohol use: Never   Drug use: Never  Sexual activity: Not on file  Other Topics Concern   Not on file  Social History Narrative   Not on file   Social Drivers of Health   Financial Resource Strain: Low Risk  (09/07/2022)   Overall Financial Resource Strain (CARDIA)    Difficulty of Paying Living Expenses: Not hard at all  Food Insecurity: No Food Insecurity (09/07/2022)   Hunger Vital Sign    Worried About Running Out of Food in the Last Year: Never true    Ran Out of Food in the Last Year: Never true  Transportation Needs: No Transportation Needs (09/07/2022)   PRAPARE - Administrator, Civil Service (Medical): No    Lack of Transportation (Non-Medical): No  Physical Activity: Sufficiently Active (09/07/2022)   Exercise Vital Sign    Days of Exercise per Week: 5 days    Minutes of Exercise per Session: 40 min  Stress: No Stress Concern Present (09/07/2022)   [employer] of Occupational Health - Occupational Stress Questionnaire    Feeling of Stress : Not at all  Social Connections: Socially Integrated  (09/07/2022)   Social Connection and Isolation Panel    Frequency of Communication with Friends and Family: More than three times a week    Frequency of Social Gatherings with Friends and Family: More than three times a week    Attends Religious Services: More than 4 times per year    Active Member of [employer] or Organizations: Yes    Attends Engineer, structural: More than 4 times per year    Marital Status: Married   Past Medical History:  Diagnosis Date   Allergy    Past Surgical History:  Procedure Laterality Date   EYE SURGERY      Family History  Problem Relation Age of Onset   Hypertension Other    Diabetes Other    Prostate cancer Other     ROS CONSTITUTIONAL: Negative for chills, fatigue, fever, unintentional weight gain and unintentional weight loss.  E/N/T: Negative for ear pain, nasal congestion and sore throat.  CARDIOVASCULAR: Negative for chest pain, dizziness, palpitations and pedal edema.  RESPIRATORY: Negative for recent cough and dyspnea.  GASTROINTESTINAL: Negative for abdominal pain, acid reflux symptoms, constipation, diarrhea, nausea and vomiting.  MSK: Negative for arthralgias and myalgias.  INTEGUMENTARY: Negative for rash.  NEUROLOGICAL: Negative for dizziness and headaches.  PSYCHIATRIC: Negative for sleep disturbance and to question depression screen.  Negative for depression, negative for anhedonia.   Objective:  PHYSICAL EXAM:   BP 100/72   Pulse 83   Temp (!) 97.5 F (36.4 C)   Ht 5' 1.5 (1.562 m)   Wt 146 lb 12.8 oz (66.6 kg)   LMP  (LMP Unknown)   SpO2 98%   BMI 27.29 kg/m   Vision Screening   Right eye Left eye Both eyes  Without correction     With correction 20/20 20/20 20/20     GEN: Well nourished, well developed, in no acute distress  HEENT: normal external ears and nose - normal external auditory canals and TMS - hearing grossly normal -- Lips, Teeth and Gums - normal  Oropharynx - normal mucosa, palate, and  posterior pharynx Neck: no JVD or masses - no thyromegaly Cardiac: RRR; no murmurs, rubs, or gallops,no edema - no significant varicosities Respiratory:  normal respiratory rate and pattern with no distress - normal breath sounds with no rales, rhonchi, wheezes or rubs GI: normal bowel sounds, no masses or tenderness MS: no  deformity or atrophy  Skin: warm and dry, no rash  Neuro:  Alert and Oriented x 3, Strength and sensation are intact - CN II-Xii grossly intact Psych: euthymic mood, appropriate affect and demeanor Office Visit on 10/11/2023  Component Date Value Ref Range Status   Color, UA 10/11/2023 yellow  yellow Final   Clarity, UA 10/11/2023 clear  clear Final   Glucose, UA 10/11/2023 negative  negative mg/dL Final   Bilirubin, UA 91/94/7974 negative  negative Final   Ketones, POC UA 10/11/2023 negative  negative mg/dL Final   Spec Grav, UA 91/94/7974 1.025  1.010 - 1.025 Final   Blood, UA 10/11/2023 trace-lysed (A)  negative Final   pH, UA 10/11/2023 5.5  5.0 - 8.0 Final   POC PROTEIN,UA 10/11/2023 trace  negative, trace Final   Urobilinogen, UA 10/11/2023 0.2  0.2 or 1.0 E.U./dL Final   Nitrite, UA 91/94/7974 Negative  Negative Final   Leukocytes, UA 10/11/2023 Negative  Negative Final    Assessment & Plan:  Annual physical exam -     POCT URINALYSIS DIP (CLINITEK) -     CBC with Differential/Platelet -     Comprehensive metabolic panel with GFR -     TSH -     Lipid panel  Asymptomatic microscopic hematuria -     POCT URINALYSIS DIP (CLINITEK)  Colon cancer screening -     Cologuard  Mild intermittent asthma without complication -     Fluticasone -Salmeterol; Inhale 2 puffs into the lungs 2 (two) times daily.  Dispense: 1 each; Refill: 12    This is a list of the screening recommended for you and due dates:  Health Maintenance  Topic Date Due   Flu Shot  06/05/2024*   Pap with HPV screening  10/10/2024*   Pneumococcal Vaccine for age over 72 (1 of 1 - PCV)  10/10/2024*   Hepatitis B Vaccine (1 of 3 - 19+ 3-dose series) 10/10/2024*   Mammogram  10/10/2024*   Colon Cancer Screening  10/10/2024*   DTaP/Tdap/Td vaccine (2 - Td or Tdap) 11/18/2029   HPV Vaccine  Aged Out   Meningitis B Vaccine  Aged Out   COVID-19 Vaccine  Discontinued   Hepatitis C Screening  Discontinued   HIV Screening  Discontinued   Zoster (Shingles) Vaccine  Discontinued  *Topic was postponed. The date shown is not the original due date.     Follow-up: Return in about 6 months (around 04/12/2024) for follow-up.  An After Visit Summary was printed and given to the patient.  CAMIE JONELLE [facility]ICHOLAUS [facility] [phone]

## 2023-10-12 ENCOUNTER — Ambulatory Visit: Payer: Self-pay | Admitting: Physician Assistant

## 2023-10-12 LAB — LIPID PANEL
Chol/HDL Ratio: 2.7 ratio (ref 0.0–4.4)
Cholesterol, Total: 200 mg/dL — ABNORMAL HIGH (ref 100–199)
HDL: 73 mg/dL (ref >39)
LDL Chol Calc (NIH): 104 mg/dL — ABNORMAL HIGH (ref 0–99)
Triglycerides: 132 mg/dL (ref 0–149)
VLDL Cholesterol Cal: 23 mg/dL (ref 5–40)

## 2023-10-12 LAB — COMPREHENSIVE METABOLIC PANEL WITH GFR
ALT: 14 IU/L (ref 0–32)
AST: 12 IU/L (ref 0–40)
Albumin: 4.5 g/dL (ref 3.8–4.9)
Alkaline Phosphatase: 59 IU/L (ref 44–121)
BUN/Creatinine Ratio: 20 (ref 9–23)
BUN: 19 mg/dL (ref 6–24)
Bilirubin Total: 0.6 mg/dL (ref 0.0–1.2)
CO2: 20 mmol/L (ref 20–29)
Calcium: 9.2 mg/dL (ref 8.7–10.2)
Chloride: 104 mmol/L (ref 96–106)
Creatinine, Ser: 0.95 mg/dL (ref 0.57–1.00)
Globulin, Total: 2 g/dL (ref 1.5–4.5)
Glucose: 79 mg/dL (ref 70–99)
Potassium: 4.5 mmol/L (ref 3.5–5.2)
Sodium: 137 mmol/L (ref 134–144)
Total Protein: 6.5 g/dL (ref 6.0–8.5)
eGFR: 71 mL/min/1.73 (ref >59)

## 2023-10-12 LAB — CBC WITH DIFFERENTIAL/PLATELET
Basophils Absolute: 0 x10E3/uL (ref 0.0–0.2)
Basos: 1 %
EOS (ABSOLUTE): 0.1 x10E3/uL (ref 0.0–0.4)
Eos: 2 %
Hematocrit: 39.1 % (ref 34.0–46.6)
Hemoglobin: 12.8 g/dL (ref 11.1–15.9)
Immature Grans (Abs): 0 x10E3/uL (ref 0.0–0.1)
Immature Granulocytes: 0 %
Lymphocytes Absolute: 1.6 x10E3/uL (ref 0.7–3.1)
Lymphs: 33 %
MCH: 31.6 pg (ref 26.6–33.0)
MCHC: 32.7 g/dL (ref 31.5–35.7)
MCV: 97 fL (ref 79–97)
Monocytes Absolute: 0.4 x10E3/uL (ref 0.1–0.9)
Monocytes: 9 %
Neutrophils Absolute: 2.6 x10E3/uL (ref 1.4–7.0)
Neutrophils: 55 %
Platelets: 253 x10E3/uL (ref 150–450)
RBC: 4.05 x10E6/uL (ref 3.77–5.28)
RDW: 13.8 % (ref 11.7–15.4)
WBC: 4.7 x10E3/uL (ref 3.4–10.8)

## 2023-10-12 LAB — TSH: TSH: 0.872 u[IU]/mL (ref 0.450–4.500)

## 2023-10-12 NOTE — Telephone Encounter (Signed)
 When I put in that rx it showed it was on her formulary - recommend pt contact her insurance to see what has cheaper coverage - I have no way of knowing what would be most cost effective for her

## 2023-10-12 NOTE — Telephone Encounter (Signed)
 Mychart message sent.

## 2023-10-31 LAB — COLOGUARD: COLOGUARD: NEGATIVE

## 2023-11-29 ENCOUNTER — Telehealth: Payer: Self-pay

## 2023-11-29 NOTE — Telephone Encounter (Signed)
 Lisa Cantrell returned call. Accidentally changed this CRM into a phone note.  Copied from CRM #[id number]. Topic: General - Call Back - No Documentation >> Nov 29, 2023  2:13 PM Lisa Cantrell wrote: Reason for CRM: patient is calling in she has questions about her physical on 10/11/2023 please call patient back  [phone]

## 2023-12-06 ENCOUNTER — Encounter: Payer: Self-pay | Admitting: Family Medicine

## 2023-12-06 ENCOUNTER — Ambulatory Visit: Admitting: Family Medicine

## 2023-12-06 VITALS — BP 110/72 | HR 87 | Temp 98.2°F | Ht 61.5 in | Wt 141.0 lb

## 2023-12-06 DIAGNOSIS — J0100 Acute maxillary sinusitis, unspecified: Secondary | ICD-10-CM | POA: Insufficient documentation

## 2023-12-06 LAB — POC COVID19 BINAXNOW: SARS Coronavirus 2 Ag: NEGATIVE

## 2023-12-06 LAB — POCT INFLUENZA A/B
Influenza A, POC: NEGATIVE
Influenza B, POC: NEGATIVE

## 2023-12-06 MED ORDER — CLARITHROMYCIN ER 500 MG PO TB24: 1000.0000 mg | ORAL_TABLET | Freq: Every day | ORAL | 0 refills | Status: AC

## 2023-12-06 MED ORDER — CLARITHROMYCIN ER 500 MG PO TB24: 1000.0000 mg | ORAL_TABLET | Freq: Every day | ORAL | 0 refills | Status: DC

## 2023-12-06 MED ORDER — FLUTICASONE PROPIONATE 50 MCG/ACT NA SUSP: 2.0000 | Freq: Every day | NASAL | 6 refills | Status: AC

## 2023-12-06 MED ORDER — FLUTICASONE PROPIONATE 50 MCG/ACT NA SUSP: 2.0000 | Freq: Every day | NASAL | 6 refills | Status: DC

## 2023-12-06 NOTE — Assessment & Plan Note (Addendum)
 Acute upper respiratory infection with sinus congestion Symptoms include sinus pressure, congestion, and sore throat. Negative COVID-19 test. Clarithromycin chosen due to penicillin allergy. - Prescribed clarithromycin for 7 days. - Recommended Flonase nasal spray. - Provided work note for return to work. Orders:   POCT Influenza A/B   POC COVID-19 BinaxNow   clarithromycin (BIAXIN XL) 500 MG 24 hr tablet; Take 2 tablets (1,000 mg total) by mouth daily.   fluticasone  (FLONASE) 50 MCG/ACT nasal spray; Place 2 sprays into both nostrils daily.

## 2023-12-06 NOTE — Progress Notes (Signed)
 Acute Office Visit  Subjective:    Patient ID: Lisa Cantrell, female    DOB: [birth year], 53 y.o.   MRN: [id number]  Chief Complaint  Patient presents with   URI    X3 weeks, congestions and PND-some nausea last night, sore throat-resolved. Taking otc cough syrup.  WORK NOTE PENDED    HPI:   Discussed the use of AI scribe software for clinical note transcription with the patient, who gave verbal consent to proceed.  History of Present Illness Lisa Cantrell is a 53 year old female with asthma who presents with persistent sinus congestion and pressure for three weeks.  Sinonasal congestion and pressure - Persistent sinus congestion and significant pressure for approximately three weeks - No cough, fever, chills, or sweats - Regular hot flashes, making it difficult to discern presence of fever - Difficulty breathing through nose at night, resulting in disrupted sleep - Over-the-counter medications (DayQuil, NyQuil) provide only temporary relief; symptoms recur after use - Mucinex DM was tried without benefit  Asthma symptoms - History of asthma, typically presenting as cough - No current cough reported  Antibiotic allergies and efficacy - Allergy to penicillin - Azithromycin  has not been effective in the past  Occupational considerations - Works in school nutrition - Needs to return to work soon    Past Medical History:  Diagnosis Date   Allergy     Past Surgical History:  Procedure Laterality Date   EYE SURGERY      Family History  Problem Relation Age of Onset   Hypertension Other    Diabetes Other    Prostate cancer Other     Social History   Socioeconomic History   Marital status: Married    Spouse name: Not on file   Number of children: 2   Years of education: Not on file   Highest education level: Not on file  Occupational History   Occupation: self employed  Tobacco Use   Smoking status: Never   Smokeless tobacco: Never   Vaping Use   Vaping status: Never Used  Substance and Sexual Activity   Alcohol use: Never   Drug use: Never   Sexual activity: Not on file  Other Topics Concern   Not on file  Social History Narrative   Not on file   Social Drivers of Health   Financial Resource Strain: Low Risk  (09/07/2022)   Overall Financial Resource Strain (CARDIA)    Difficulty of Paying Living Expenses: Not hard at all  Food Insecurity: No Food Insecurity (12/06/2023)   Hunger Vital Sign    Worried About Running Out of Food in the Last Year: Never true    Ran Out of Food in the Last Year: Never true  Transportation Needs: No Transportation Needs (12/06/2023)   PRAPARE - Administrator, Civil Service (Medical): No    Lack of Transportation (Non-Medical): No  Physical Activity: Sufficiently Active (09/07/2022)   Exercise Vital Sign    Days of Exercise per Week: 5 days    Minutes of Exercise per Session: 40 min  Stress: No Stress Concern Present (09/07/2022)   [employer] of Occupational Health - Occupational Stress Questionnaire    Feeling of Stress : Not at all  Social Connections: Socially Integrated (09/07/2022)   Social Connection and Isolation Panel    Frequency of Communication with Friends and Family: More than three times a week    Frequency of Social Gatherings with Friends and Family: More than  three times a week    Attends Religious Services: More than 4 times per year    Active Member of Clubs or Organizations: Yes    Attends Banker Meetings: More than 4 times per year    Marital Status: Married  Catering manager Violence: Not At Risk (12/06/2023)   Humiliation, Afraid, Rape, and Kick questionnaire    Fear of Current or Ex-Partner: No    Emotionally Abused: No    Physically Abused: No    Sexually Abused: No    Outpatient Medications Prior to Visit  Medication Sig Dispense Refill   buPROPion (WELLBUTRIN XL) 150 MG 24 hr tablet      estradiol (VIVELLE-DOT) 0.075  MG/24HR Dotti 0.075 mg/24 hr transdermal patch     fluticasone -salmeterol (ADVAIR HFA) 115-21 MCG/ACT inhaler Inhale 2 puffs into the lungs 2 (two) times daily. 1 each 12   folic acid (FOLVITE) 1 MG tablet Take 1 mg by mouth daily.     methotrexate (RHEUMATREX) 2.5 MG tablet Take 17.5 mg by mouth once a week.     ondansetron (ZOFRAN) 4 MG tablet Take 4 mg by mouth every 6 (six) hours as needed for nausea or vomiting.     progesterone (PROMETRIUM) 100 MG capsule Take 100 mg by mouth daily.     albuterol  (VENTOLIN  HFA) 108 (90 Base) MCG/ACT inhaler INHALE 2 PUFFS INTO THE LUNGS EVERY 6 HOURS AS NEEDED FOR WHEEZING OR SHORTNESS OF BREATH 6.7 g 2   No facility-administered medications prior to visit.    Allergies  Allergen Reactions   Hydromorphone Hcl Itching   Penicillins Hives   Hydromorphone Rash   Penicillin G Rash    Review of Systems  Constitutional:  Negative for fever.  All other systems reviewed and are negative.      Objective:        12/06/2023    9:44 AM 10/11/2023    9:43 AM 07/11/2023    3:14 PM  Vitals with BMI  Height 5' 1.5 5' 1.5 5' 1.5  Weight 141 lbs 146 lbs 13 oz 152 lbs  BMI 26.21 27.29 28.26  Systolic 110 100 881  Diastolic 72 72 60  Pulse 87 83 80    No data found.   Physical Exam Vitals reviewed.  Constitutional:      Appearance: Normal appearance.  HENT:     Right Ear: Tympanic membrane, ear canal and external ear normal.     Left Ear: Tympanic membrane, ear canal and external ear normal.     Nose: Congestion present.     Comments: Max sinus tenderness RIGHT > Left    Mouth/Throat:     Pharynx: Oropharynx is clear. Posterior oropharyngeal erythema present.  Cardiovascular:     Rate and Rhythm: Normal rate and regular rhythm.     Heart sounds: Normal heart sounds. No murmur heard. Pulmonary:     Effort: Pulmonary effort is normal. No respiratory distress.     Breath sounds: Normal breath sounds.  Neurological:     Mental Status: She is  alert and oriented to person, place, and time.  Psychiatric:        Mood and Affect: Mood normal.        Behavior: Behavior normal.     There are no preventive care reminders to display for this patient.  There are no preventive care reminders to display for this patient.   Lab Results  Component Value Date   TSH 0.872 10/11/2023   Lab Results  Component Value Date   WBC 4.7 10/11/2023   HGB 12.8 10/11/2023   HCT 39.1 10/11/2023   MCV 97 10/11/2023   PLT 253 10/11/2023   Lab Results  Component Value Date   NA 137 10/11/2023   K 4.5 10/11/2023   CO2 20 10/11/2023   GLUCOSE 79 10/11/2023   BUN 19 10/11/2023   CREATININE 0.95 10/11/2023   BILITOT 0.6 10/11/2023   ALKPHOS 59 10/11/2023   AST 12 10/11/2023   ALT 14 10/11/2023   PROT 6.5 10/11/2023   ALBUMIN 4.5 10/11/2023   CALCIUM 9.2 10/11/2023   EGFR 71 10/11/2023   Lab Results  Component Value Date   CHOL 200 (H) 10/11/2023   Lab Results  Component Value Date   HDL 73 10/11/2023   Lab Results  Component Value Date   LDLCALC 104 (H) 10/11/2023   Lab Results  Component Value Date   TRIG 132 10/11/2023   Lab Results  Component Value Date   CHOLHDL 2.7 10/11/2023   Lab Results  Component Value Date   HGBA1C 4.9 09/07/2022        Results for orders placed or performed in visit on 12/06/23  POC COVID-19 BinaxNow   Collection Time: 12/06/23 10:14 AM  Result Value Ref Range   SARS Coronavirus 2 Ag Negative Negative  POCT Influenza A/B   Collection Time: 12/06/23 10:15 AM  Result Value Ref Range   Influenza A, POC Negative Negative   Influenza B, POC Negative Negative     Assessment & Plan:   Assessment & Plan Acute non-recurrent maxillary sinusitis Acute upper respiratory infection with sinus congestion Symptoms include sinus pressure, congestion, and sore throat. Negative COVID-19 test. Clarithromycin chosen due to penicillin allergy. - Prescribed clarithromycin for 7 days. - Recommended  Flonase nasal spray. - Provided work note for return to work. Orders:   POCT Influenza A/B   POC COVID-19 BinaxNow   clarithromycin (BIAXIN XL) 500 MG 24 hr tablet; Take 2 tablets (1,000 mg total) by mouth daily.   fluticasone  (FLONASE) 50 MCG/ACT nasal spray; Place 2 sprays into both nostrils daily.     Body mass index is 26.21 kg/m..    Meds ordered this encounter  Medications   DISCONTD: clarithromycin (BIAXIN XL) 500 MG 24 hr tablet    Sig: Take 2 tablets (1,000 mg total) by mouth daily.    Dispense:  14 tablet    Refill:  0   DISCONTD: fluticasone  (FLONASE) 50 MCG/ACT nasal spray    Sig: Place 2 sprays into both nostrils daily.    Dispense:  16 g    Refill:  6   clarithromycin (BIAXIN XL) 500 MG 24 hr tablet    Sig: Take 2 tablets (1,000 mg total) by mouth daily.    Dispense:  14 tablet    Refill:  0   fluticasone  (FLONASE) 50 MCG/ACT nasal spray    Sig: Place 2 sprays into both nostrils daily.    Dispense:  16 g    Refill:  6    Orders Placed This Encounter  Procedures   POCT Influenza A/B   POC COVID-19 BinaxNow     Follow-up: No follow-ups on file.  An After Visit Summary was printed and given to the patient.  Abigail Free, MD [facility] [phone]

## 2023-12-24 ENCOUNTER — Other Ambulatory Visit: Payer: Self-pay | Admitting: Physician Assistant

## 2023-12-24 DIAGNOSIS — J3089 Other allergic rhinitis: Secondary | ICD-10-CM

## 2024-03-10 ENCOUNTER — Other Ambulatory Visit: Payer: Self-pay | Admitting: Physician Assistant

## 2024-03-10 DIAGNOSIS — J3089 Other allergic rhinitis: Secondary | ICD-10-CM

## 2024-04-17 ENCOUNTER — Ambulatory Visit: Admitting: Physician Assistant
